# Patient Record
Sex: Male | Born: 1985
Health system: Southern US, Community
[De-identification: ages and names within clinical notes are randomized; demographics above are authoritative.]

## PROBLEM LIST (undated history)

## (undated) DIAGNOSIS — E119 Type 2 diabetes mellitus without complications: Secondary | ICD-10-CM

## (undated) DIAGNOSIS — T7840XA Allergy, unspecified, initial encounter: Secondary | ICD-10-CM

## (undated) DIAGNOSIS — I10 Essential (primary) hypertension: Secondary | ICD-10-CM

## (undated) HISTORY — DX: Type 2 diabetes mellitus without complications: E11.9

## (undated) HISTORY — DX: Essential (primary) hypertension: I10

## (undated) HISTORY — DX: Allergy, unspecified, initial encounter: T78.40XA

---
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
## 2011-08-19 ENCOUNTER — Emergency Department: Payer: Self-pay | Admitting: Emergency Medicine

## 2018-02-18 DIAGNOSIS — R7989 Other specified abnormal findings of blood chemistry: Secondary | ICD-10-CM

## 2018-02-18 HISTORY — DX: Other specified abnormal findings of blood chemistry: R79.89

## 2020-03-28 NOTE — Progress Notes (Signed)
Scheduled to complete physical 04/17/20.  (Provider TBD)  AMD 

## 2020-03-29 ENCOUNTER — Other Ambulatory Visit: Payer: Self-pay

## 2020-03-29 ENCOUNTER — Ambulatory Visit: Payer: Self-pay

## 2020-03-29 DIAGNOSIS — Z0000 Encounter for general adult medical examination without abnormal findings: Secondary | ICD-10-CM

## 2020-03-30 LAB — CMP12+LP+TP+TSH+6AC+CBC/D/PLT
ALT: 37 IU/L (ref 0–44)
AST: 32 IU/L (ref 0–40)
Albumin/Globulin Ratio: 1.7 (ref 1.2–2.2)
Albumin: 4.6 g/dL (ref 4.0–5.0)
Alkaline Phosphatase: 78 IU/L (ref 39–117)
BUN/Creatinine Ratio: 10 (ref 9–20)
BUN: 12 mg/dL (ref 6–20)
Basophils Absolute: 0 10*3/uL (ref 0.0–0.2)
Basos: 1 %
Bilirubin Total: 0.3 mg/dL (ref 0.0–1.2)
Calcium: 9.8 mg/dL (ref 8.7–10.2)
Chloride: 100 mmol/L (ref 96–106)
Chol/HDL Ratio: 4.2 ratio (ref 0.0–5.0)
Cholesterol, Total: 186 mg/dL (ref 100–199)
Creatinine, Ser: 1.19 mg/dL (ref 0.76–1.27)
EOS (ABSOLUTE): 0.1 10*3/uL (ref 0.0–0.4)
Eos: 1 %
Estimated CHD Risk: 0.8 times avg. (ref 0.0–1.0)
Free Thyroxine Index: 2.1 (ref 1.2–4.9)
GFR calc Af Amer: 92 mL/min/{1.73_m2} (ref >59)
GFR calc non Af Amer: 80 mL/min/{1.73_m2} (ref >59)
GGT: 56 IU/L (ref 0–65)
Globulin, Total: 2.7 g/dL (ref 1.5–4.5)
Glucose: 87 mg/dL (ref 65–99)
HDL: 44 mg/dL (ref >39)
Hematocrit: 44.2 % (ref 37.5–51.0)
Hemoglobin: 14.1 g/dL (ref 13.0–17.7)
Immature Grans (Abs): 0 10*3/uL (ref 0.0–0.1)
Immature Granulocytes: 0 %
Iron: 99 ug/dL (ref 38–169)
LDH: 222 IU/L (ref 121–224)
LDL Chol Calc (NIH): 103 mg/dL — ABNORMAL HIGH (ref 0–99)
Lymphocytes Absolute: 2.4 10*3/uL (ref 0.7–3.1)
Lymphs: 40 %
MCH: 27 pg (ref 26.6–33.0)
MCHC: 31.9 g/dL (ref 31.5–35.7)
MCV: 85 fL (ref 79–97)
Monocytes Absolute: 0.7 10*3/uL (ref 0.1–0.9)
Monocytes: 11 %
Neutrophils Absolute: 2.8 10*3/uL (ref 1.4–7.0)
Neutrophils: 47 %
Phosphorus: 1.9 mg/dL — ABNORMAL LOW (ref 2.8–4.1)
Platelets: 245 10*3/uL (ref 150–450)
Potassium: 4.1 mmol/L (ref 3.5–5.2)
RBC: 5.22 x10E6/uL (ref 4.14–5.80)
RDW: 12.3 % (ref 11.6–15.4)
Sodium: 137 mmol/L (ref 134–144)
T3 Uptake Ratio: 28 % (ref 24–39)
T4, Total: 7.6 ug/dL (ref 4.5–12.0)
TSH: 3.38 u[IU]/mL (ref 0.450–4.500)
Total Protein: 7.3 g/dL (ref 6.0–8.5)
Triglycerides: 226 mg/dL — ABNORMAL HIGH (ref 0–149)
Uric Acid: 6.9 mg/dL (ref 3.8–8.4)
VLDL Cholesterol Cal: 39 mg/dL (ref 5–40)
WBC: 6 10*3/uL (ref 3.4–10.8)

## 2020-04-17 ENCOUNTER — Encounter: Payer: Self-pay | Admitting: Emergency Medicine

## 2020-04-23 ENCOUNTER — Encounter: Payer: Self-pay | Admitting: Physician Assistant

## 2020-05-07 ENCOUNTER — Encounter: Payer: Self-pay | Admitting: Emergency Medicine

## 2020-05-07 ENCOUNTER — Ambulatory Visit: Payer: Self-pay | Admitting: Emergency Medicine

## 2020-05-07 ENCOUNTER — Other Ambulatory Visit: Payer: Self-pay

## 2020-05-07 VITALS — BP 191/92 | HR 129 | Temp 98.1°F | Resp 16 | Ht 70.0 in | Wt 339.0 lb

## 2020-05-07 DIAGNOSIS — Z0000 Encounter for general adult medical examination without abnormal findings: Secondary | ICD-10-CM

## 2020-05-07 DIAGNOSIS — I10 Essential (primary) hypertension: Secondary | ICD-10-CM

## 2020-05-07 DIAGNOSIS — Z91199 Patient's noncompliance with other medical treatment and regimen due to unspecified reason: Secondary | ICD-10-CM

## 2020-05-07 DIAGNOSIS — I4892 Unspecified atrial flutter: Secondary | ICD-10-CM

## 2020-05-07 LAB — POCT URINALYSIS DIPSTICK
Bilirubin, UA: NEGATIVE
Blood, UA: NEGATIVE
Glucose, UA: NEGATIVE
Ketones, UA: NEGATIVE
Leukocytes, UA: NEGATIVE
Nitrite, UA: NEGATIVE
Protein, UA: NEGATIVE
Spec Grav, UA: 1.02 (ref 1.010–1.025)
Urobilinogen, UA: 0.2 E.U./dL
pH, UA: 6 (ref 5.0–8.0)

## 2020-05-07 MED ORDER — AMLODIPINE BESYLATE 5 MG PO TABS: 5.0000 mg | ORAL_TABLET | Freq: Every day | ORAL | 3 refills | Status: DC

## 2020-05-07 NOTE — Progress Notes (Signed)
__________________________________________  I have reviewed the triage vital signs and the nursing notes.   HISTORY  Chief Complaint Employment Physical   HPI YSMAEL Morrison is a 34 y.o. male   Is here for his yearly physical.  He denies any medical complaints.  He states he always gets nervous when he comes to get his physical.  He reports that he ran out of blood pressure medication 2019 and because of Covid never called to see if he could get it refilled.  He denies any dizziness, shortness of breath, chest pain with resting or exertion.  Patient does have a family history of hypertension.   No past medical history on file.  There are no problems to display for this patient.    Prior to Admission medications   Medication Sig Start Date End Date Taking? Authorizing Provider  amLODipine (NORVASC) 5 MG tablet Take 1 tablet (5 mg total) by mouth daily. 05/07/20   [name], PA-C    Allergies Patient has no known allergies.  Family History  Problem Relation Age of Onset  . Hypertension Mother   . Heart disease Maternal Grandmother     Social History Social History   Tobacco Use  . Smoking status: Never Smoker  . Smokeless tobacco: Never Used  Substance Use Topics  . Alcohol use: Never  . Drug use: Never    Review of Systems Constitutional: No fever/chills Eyes: No visual changes. ENT: No sore throat. Cardiovascular: Denies chest pain. Respiratory: Denies shortness of breath.  Negative for cough. Gastrointestinal: No abdominal pain.  No nausea, no vomiting.   Musculoskeletal: Negative for muscle skeletal pain. Skin: Negative for rash. Neurological: Negative for headaches, focal weakness or numbness. ____________________________________________   PHYSICAL EXAM: Constitutional: Alert and oriented. Well appearing and in no acute distress. Eyes: Conjunctivae are normal. PERRL. EOMI. Head: Atraumatic. Nose: No congestion/rhinnorhea. Neck: No  stridor.   Cardiovascular: Rapid rate, regular rhythm. Grossly normal heart sounds.  Good peripheral circulation. Respiratory: Normal respiratory effort.  No retractions. Lungs CTAB. Gastrointestinal: Soft and nontender. No distention.  Bowel sounds are normoactive x4 quadrants.  BMI 48.6 Musculoskeletal: No tenderness is noted on palpation of the thoracic or lumbar spine.  Patient is able move upper and lower extremities without any difficulty.  No edema is noted to the lower extremities.  Normal gait was noted. Neurologic:  Normal speech and language. No gross focal neurologic deficits are appreciated. No gait instability. Skin:  Skin is warm, dry and intact. No rash noted. Psychiatric: Mood and affect are normal. Speech and behavior are normal.  ____________________________________________   LABS (all labs ordered are listed, but only abnormal results are displayed)  Discussed with patient. ____________________________________________  EKG No previous EKGs for comparison. Atrial flutter with a rate of 117.   FINAL CLINICAL IMPRESSION(S) / ED DIAGNOSES Annual physical exam Hypertension uncontrolled and medically noncompliant Atrial flutter  34 year old male is here for an annual physical exam with the [name].  He has not been on his blood pressure medication in more than a year and records indicate that he was taking Norvasc 5 mg daily prior to discontinuing it.  Patient states that he always gets extremely nervous when he is in the doctor's office.  Patient has a family history of hypertension.  Previous visits in his paper chart show elevation of his blood pressure.  Patient denies any chest pain, shortness of breath, dizziness, headaches or fatigue.  There is no way of knowing how long his  blood pressure has been elevated or atrial flutter.  Patient was told to begin with a baby aspirin per day.  He is to start back on his Norvasc immediately and a prescription was sent to  his pharmacy.  He is also agreeable to be evaluated by a cardiologist as he is just 34 years old.  He is aware that he is stable but should he began having any of the above symptoms he is to go immediately to the emergency department.  Referral was made for patient to be seen by cardiology.    ED Discharge Orders         Ordered    EKG 12-Lead        05/07/20 1556    amLODipine (NORVASC) 5 MG tablet  Daily     Discontinue  Reprint     05/07/20 1635    Ambulatory referral to Cardiology     Discontinue    Comments: Newly discovered A-Flutter.  Hx of HTN not controlled.  Pt. Not taking medication for 1 year   05/07/20 1635    POCT urinalysis dipstick        05/07/20 1658           Note:  This document was prepared using Dragon voice recognition software and may include unintentional dictation errors.

## 2020-05-14 ENCOUNTER — Other Ambulatory Visit: Payer: Self-pay

## 2020-05-14 ENCOUNTER — Encounter: Payer: Self-pay | Admitting: Cardiology

## 2020-05-14 ENCOUNTER — Ambulatory Visit (INDEPENDENT_AMBULATORY_CARE_PROVIDER_SITE_OTHER): Payer: 59 | Admitting: Cardiology

## 2020-05-14 VITALS — BP 180/100 | HR 108 | Ht 70.0 in | Wt 336.5 lb

## 2020-05-14 DIAGNOSIS — I10 Essential (primary) hypertension: Secondary | ICD-10-CM | POA: Diagnosis not present

## 2020-05-14 DIAGNOSIS — R9431 Abnormal electrocardiogram [ECG] [EKG]: Secondary | ICD-10-CM | POA: Diagnosis not present

## 2020-05-14 MED ORDER — HYDROCHLOROTHIAZIDE 25 MG PO TABS: 25.0000 mg | ORAL_TABLET | Freq: Every day | ORAL | 6 refills | Status: DC

## 2020-05-14 NOTE — Progress Notes (Signed)
Cardiology Office Note:    Date:  05/14/2020   ID:  Randall Morrison, DOB 08/04/1986, MRN [id number]  PCP:  No primary care provider on file.  [facility] Cardiologist:  Debbe Odea, MD  [facility] Electrophysiologist:  None   Referring MD: Tommi Rumps, PA-C   Chief Complaint  Patient presents with  . OTHER    Atrial flutter/Elevated BP. Meds reviewed verbally with pt.   Randall Morrison is a 34 y.o. male who is being seen today for the evaluation of atrial flutter at the request of Tommi Rumps, PA-C.   History of Present Illness:    Randall Morrison is a 34 y.o. male with a hx of hypertension who presents due to newly diagnosed atrial flutter.  Patient presented to the city [facility] for regular physical exam.  EKG was read as atrial flutter.  Patient denies any history of heart disease.  Denies palpitations, chest pain or shortness of breath.  Was started on aspirin and advised to follow-up with cardiology.   Patient states that with history of hypertension.  Did not take any blood pressure medications for a while due to the appointment.  Recently restarted on Norvasc 5 mg daily.  Checks his blood pressure frequently at home and systolics are usually in the 140s to 150s.    Past Medical History:  Diagnosis Date  . Hypertension     Past Surgical History:  Procedure Laterality Date  . TONSILLECTOMY Bilateral 1995    Current Medications: Current Meds  Medication Sig  . amLODipine (NORVASC) 5 MG tablet Take 1 tablet (5 mg total) by mouth daily.     Allergies:   Patient has no known allergies.   Social History   Socioeconomic History  . Marital status: Single    Spouse name: Not on file  . Number of children: Not on file  . Years of education: Not on file  . Highest education level: Not on file  Occupational History  . Not on file  Tobacco Use  . Smoking status: Never Smoker  . Smokeless tobacco: Never Used  Substance and  Sexual Activity  . Alcohol use: Yes    Comment: Social  . Drug use: Never  . Sexual activity: Not on file  Other Topics Concern  . Not on file  Social History Narrative  . Not on file   Social Determinants of Health   Financial Resource Strain:   . Difficulty of Paying Living Expenses:   Food Insecurity:   . Worried About Programme researcher, broadcasting/film/video in the Last Year:   . Barista in the Last Year:   Transportation Needs:   . Freight forwarder (Medical):   Marland Kitchen Lack of Transportation (Non-Medical):   Physical Activity:   . Days of Exercise per Week:   . Minutes of Exercise per Session:   Stress:   . Feeling of Stress :   Social Connections:   . Frequency of Communication with Friends and Family:   . Frequency of Social Gatherings with Friends and Family:   . Attends Religious Services:   . Active Member of Clubs or Organizations:   . Attends Banker Meetings:   Marland Kitchen Marital Status:      Family History: The patient's family history includes Heart disease in his maternal grandmother; Hypertension in his mother.  ROS:   Please see the history of present illness.     All other systems reviewed and are negative.  EKGs/Labs/Other Studies Reviewed:    The following studies were reviewed today:   EKG:  EKG is  ordered today.  The ekg ordered today demonstrates sinus tachycardia otherwise normal no significant  Recent Labs: 03/29/2020: ALT 37; BUN 12; Creatinine, Ser 1.19; Hemoglobin 14.1; Platelets 245; Potassium 4.1; Sodium 137; TSH 3.380  Recent Lipid Panel    Component Value Date/Time   CHOL 186 03/29/2020 0911   TRIG 226 (H) 03/29/2020 0911   HDL 44 03/29/2020 0911   CHOLHDL 4.2 03/29/2020 0911   LDLCALC 103 (H) 03/29/2020 0911    Physical Exam:    VS:  BP (!) 180/100 (BP Location: Right Arm, Patient Position: Sitting, Cuff Size: Large)   Pulse (!) 108   Ht 5\' 10"  (1.778 m)   Wt (!) 336 lb 8 oz (152.6 kg)   SpO2 98%   BMI 48.28 kg/m     Wt  Readings from Last 3 Encounters:  05/14/20 (!) 336 lb 8 oz (152.6 kg)  05/07/20 (!) 339 lb (153.8 kg)     GEN:  Well nourished, well developed in no acute distress HEENT: Normal NECK: No JVD; No carotid bruits LYMPHATICS: No lymphadenopathy CARDIAC: RRR, no murmurs, rubs, gallops RESPIRATORY:  Clear to auscultation without rales, wheezing or rhonchi  ABDOMEN: Soft, non-tender, non-distended MUSCULOSKELETAL:  No edema; No deformity  SKIN: Warm and dry NEUROLOGIC:  Alert and oriented x 3  PSYCHIATRIC:  Normal affect   ASSESSMENT:    1. Nonspecific abnormal electrocardiogram (ECG) (EKG)   2. Essential hypertension    PLAN:    In order of problems listed above:  1. Outpatient EKG read as atrial flutter.  EKG reviewed by myself and rhythm was sinus tachycardia and not atrial flutter.  EKG in the office today is sinus.  Patient reassured.  No indication for aspirin at this time. 2. Patient with history of hypertension, blood pressure not well controlled.  Start HCTZ 25 mg daily.  Continue amlodipine 5 mg daily.  Low-salt diet advised, frequent blood pressure checks at home recommended.  If BP still elevated at follow-up visit, plan to increase Norvasc.  Follow-up in 4 weeks for BP management   Medication Adjustments/Labs and Tests Ordered: Current medicines are reviewed at length with the patient today.  Concerns regarding medicines are outlined above.  Orders Placed This Encounter  Procedures  . EKG 12-Lead   Meds ordered this encounter  Medications  . hydrochlorothiazide (HYDRODIURIL) 25 MG tablet    Sig: Take 1 tablet (25 mg total) by mouth daily.    Dispense:  30 tablet    Refill:  6    Patient Instructions  Medication Instructions:   Your physician has recommended you make the following change in your medication:   START Hydrochlorothiazide: Take 1 tablet (25 mg total) by mouth daily.  *If you need a refill on your cardiac medications before your next appointment,  please call your pharmacy*   Lab Work: None Ordered. If you have labs (blood work) drawn today and your tests are completely normal, you will receive your results only by: Marland Kitchen MyChart Message (if you have MyChart) OR . A paper copy in the mail If you have any lab test that is abnormal or we need to change your treatment, we will call you to review the results.   Testing/Procedures: None Ordered.   Follow-Up: At [facility], you and your health needs are our priority.  As part of our continuing mission to provide you with exceptional heart care,  we have created designated Provider Care Teams.  These Care Teams include your primary Cardiologist (physician) and Advanced Practice Providers (APPs -  Physician Assistants and Nurse Practitioners) who all work together to provide you with the care you need, when you need it.  We recommend signing up for the patient portal called "MyChart".  Sign up information is provided on this After Visit Summary.  MyChart is used to connect with patients for Virtual Visits (Telemedicine).  Patients are able to view lab/test results, encounter notes, upcoming appointments, etc.  Non-urgent messages can be sent to your provider as well.   To learn more about what you can do with MyChart, go to [email or web address].    Your next appointment:   4 week(s)  The format for your next appointment:   In Person  Provider:   Debbe Odea, MD   Other Instructions  N/A     Signed, Debbe Odea, MD  05/14/2020 1:11 PM    [facility]

## 2020-05-14 NOTE — Patient Instructions (Addendum)
Medication Instructions:   Your physician has recommended you make the following change in your medication:   START Hydrochlorothiazide: Take 1 tablet (25 mg total) by mouth daily.  *If you need a refill on your cardiac medications before your next appointment, please call your pharmacy*   Lab Work: None Ordered. If you have labs (blood work) drawn today and your tests are completely normal, you will receive your results only by: Marland Kitchen MyChart Message (if you have MyChart) OR . A paper copy in the mail If you have any lab test that is abnormal or we need to change your treatment, we will call you to review the results.   Testing/Procedures: None Ordered.   Follow-Up: At [facility], you and your health needs are our priority.  As part of our continuing mission to provide you with exceptional heart care, we have created designated Provider Care Teams.  These Care Teams include your primary Cardiologist (physician) and Advanced Practice Providers (APPs -  Physician Assistants and Nurse Practitioners) who all work together to provide you with the care you need, when you need it.  We recommend signing up for the patient portal called "MyChart".  Sign up information is provided on this After Visit Summary.  MyChart is used to connect with patients for Virtual Visits (Telemedicine).  Patients are able to view lab/test results, encounter notes, upcoming appointments, etc.  Non-urgent messages can be sent to your provider as well.   To learn more about what you can do with MyChart, go to [email or web address].    Your next appointment:   4 week(s)  The format for your next appointment:   In Person  Provider:   Debbe Odea, MD   Other Instructions  N/A

## 2020-05-29 ENCOUNTER — Emergency Department
Admission: EM | Admit: 2020-05-29 | Discharge: 2020-05-29 | Disposition: A | Discharge status: Home / Self Care | Payer: 59 | Attending: Emergency Medicine | Admitting: Emergency Medicine

## 2020-05-29 ENCOUNTER — Other Ambulatory Visit: Payer: Self-pay

## 2020-05-29 ENCOUNTER — Emergency Department: Payer: 59

## 2020-05-29 DIAGNOSIS — R29810 Facial weakness: Secondary | ICD-10-CM | POA: Diagnosis present

## 2020-05-29 DIAGNOSIS — G510 Bell's palsy: Secondary | ICD-10-CM | POA: Insufficient documentation

## 2020-05-29 DIAGNOSIS — Z79899 Other long term (current) drug therapy: Secondary | ICD-10-CM | POA: Diagnosis not present

## 2020-05-29 DIAGNOSIS — I10 Essential (primary) hypertension: Secondary | ICD-10-CM | POA: Diagnosis not present

## 2020-05-29 LAB — COMPREHENSIVE METABOLIC PANEL
ALT: 38 U/L (ref 0–44)
AST: 29 U/L (ref 15–41)
Albumin: 4.6 g/dL (ref 3.5–5.0)
Alkaline Phosphatase: 58 U/L (ref 38–126)
Anion gap: 5 (ref 5–15)
BUN: 15 mg/dL (ref 6–20)
CO2: 30 mmol/L (ref 22–32)
Calcium: 9.6 mg/dL (ref 8.9–10.3)
Chloride: 102 mmol/L (ref 98–111)
Creatinine, Ser: 1.27 mg/dL — ABNORMAL HIGH (ref 0.61–1.24)
GFR calc Af Amer: >60 mL/min (ref >60)
GFR calc non Af Amer: >60 mL/min (ref >60)
Glucose, Bld: 144 mg/dL — ABNORMAL HIGH (ref 70–99)
Potassium: 4 mmol/L (ref 3.5–5.1)
Sodium: 137 mmol/L (ref 135–145)
Total Bilirubin: 0.6 mg/dL (ref 0.3–1.2)
Total Protein: 7.9 g/dL (ref 6.5–8.1)

## 2020-05-29 LAB — CBC
HCT: 45.6 % (ref 39.0–52.0)
Hemoglobin: 14.4 g/dL (ref 13.0–17.0)
MCH: 26.8 pg (ref 26.0–34.0)
MCHC: 31.6 g/dL (ref 30.0–36.0)
MCV: 84.9 fL (ref 80.0–100.0)
Platelets: 251 10*3/uL (ref 150–400)
RBC: 5.37 MIL/uL (ref 4.22–5.81)
RDW: 12.5 % (ref 11.5–15.5)
WBC: 6.2 10*3/uL (ref 4.0–10.5)
nRBC: 0 % (ref 0.0–0.2)

## 2020-05-29 LAB — DIFFERENTIAL
Abs Immature Granulocytes: 0.02 10*3/uL (ref 0.00–0.07)
Basophils Absolute: 0 10*3/uL (ref 0.0–0.1)
Basophils Relative: 1 %
Eosinophils Absolute: 0.1 10*3/uL (ref 0.0–0.5)
Eosinophils Relative: 2 %
Immature Granulocytes: 0 %
Lymphocytes Relative: 38 %
Lymphs Abs: 2.4 10*3/uL (ref 0.7–4.0)
Monocytes Absolute: 0.5 10*3/uL (ref 0.1–1.0)
Monocytes Relative: 8 %
Neutro Abs: 3.1 10*3/uL (ref 1.7–7.7)
Neutrophils Relative %: 51 %

## 2020-05-29 LAB — PROTIME-INR
INR: 0.9 (ref 0.8–1.2)
Prothrombin Time: 12 seconds (ref 11.4–15.2)

## 2020-05-29 LAB — APTT: aPTT: 28 seconds (ref 24–36)

## 2020-05-29 MED ORDER — PREDNISONE 20 MG PO TABS: 60.0000 mg | ORAL_TABLET | Freq: Every day | ORAL | 0 refills | Status: AC

## 2020-05-29 MED ORDER — VALACYCLOVIR HCL 1 G PO TABS
1000.0000 mg | ORAL_TABLET | Freq: Three times a day (TID) | ORAL | 0 refills | Status: AC
Start: 2020-05-29 — End: 2020-06-05

## 2020-05-29 MED ORDER — SODIUM CHLORIDE 0.9% FLUSH
3.0000 mL | Freq: Once | INTRAVENOUS | Status: DC
Start: 2020-05-29 — End: 2020-05-30

## 2020-05-29 MED ORDER — PREDNISONE 20 MG PO TABS: 60.0000 mg | ORAL_TABLET | Freq: Every day | ORAL | 0 refills | Status: DC

## 2020-05-29 NOTE — ED Triage Notes (Addendum)
Pt comes via POV from home with c/o numbness to mouth. Pt states yesterday morning he was mouthwashing and was not able to fully swish.  Pt states he hit his head hard on a forklift.  Pt also states his left eye he is not able to fully close it. Pt states numbness to left side of mouth.  Pt denies any blurred vision. Pt denies any weakness. VAN negative.

## 2020-05-29 NOTE — ED Provider Notes (Signed)
[facility] Emergency Department Provider Note  ____________________________________________   I have reviewed the triage vital signs and the nursing notes.   HISTORY  Chief Complaint Left facial weakness  History limited by: Not Limited   HPI Randall Morrison is a 34 y.o. male who presents to the emergency department today with primary complaint of left facial weakness.  The patient states that he did hit his head on a forklift couple of days before he noticed some left sided weakness.  He noticed that he had a change in the taste in his mouth.  He also noticed issues with fully closing his left eye.  The patient denies any significant headache states he had a little bit of pain right after traumatic injury although none since then.  Patient denies any weakness or numbness in his upper or lower extremities.    Records reviewed. Per medical record review patient has a history of hypertension  Past Medical History:  Diagnosis Date  . Hypertension     There are no problems to display for this patient.   Past Surgical History:  Procedure Laterality Date  . TONSILLECTOMY Bilateral 1995    Prior to Admission medications   Medication Sig Start Date End Date Taking? Authorizing Provider  amLODipine (NORVASC) 5 MG tablet Take 1 tablet (5 mg total) by mouth daily. 05/07/20   Tommi Rumps, PA-C  hydrochlorothiazide (HYDRODIURIL) 25 MG tablet Take 1 tablet (25 mg total) by mouth daily. 05/14/20 08/12/20  Debbe Odea, MD    Allergies Patient has no known allergies.  Family History  Problem Relation Age of Onset  . Hypertension Mother   . Heart disease Maternal Grandmother     Social History Social History   Tobacco Use  . Smoking status: Never Smoker  . Smokeless tobacco: Never Used  Substance Use Topics  . Alcohol use: Yes    Comment: Social  . Drug use: Never    Review of Systems Constitutional: No fever/chills Eyes: No vision  change. Left eye watering. ENT: No sore throat. Cardiovascular: Denies chest pain. Respiratory: Denies shortness of breath. Gastrointestinal: No abdominal pain.  No nausea, no vomiting.  No diarrhea.   Genitourinary: Negative for dysuria. Musculoskeletal: Negative for back pain. Skin: Negative for rash. Neurological: Left facial weakness. ____________________________________________   PHYSICAL EXAM:  VITAL SIGNS: ED Triage Vitals [05/29/20 1443]  Enc Vitals Group     BP (!) 156/99     Pulse Rate (!) 111     Resp 18     Temp 98 F (36.7 C)     Temp src      SpO2 98 %     Weight (!) 336 lb 8 oz (152.6 kg)     Height 5\' 10"  (1.778 m)     Head Circumference      Peak Flow      Pain Score 3   Constitutional: Alert and oriented.  Eyes: Slight injection left eye. ENT      Head: Normocephalic and atraumatic.      Nose: No congestion/rhinnorhea.      Mouth/Throat: Mucous membranes are moist.      Neck: No stridor. Hematological/Lymphatic/Immunilogical: No cervical lymphadenopathy. Cardiovascular: Normal rate, regular rhythm.  No murmurs, rubs, or gallops.  Respiratory: Normal respiratory effort without tachypnea nor retractions. Breath sounds are clear and equal bilaterally. No wheezes/rales/rhonchi. Gastrointestinal: Soft and non tender. No rebound. No guarding.  Genitourinary: Deferred Musculoskeletal: Normal range of motion in all extremities. No lower extremity  edema. Neurologic:  Left facial weakness with decreased left eyebrow elevation, strength. Strength 5/5 in all extremities. Sensation grossly intact.  Skin:  Skin is warm, dry and intact. No rash noted. Psychiatric: Mood and affect are normal. Speech and behavior are normal. Patient exhibits appropriate insight and judgment.  ____________________________________________    LABS (pertinent positives/negatives)  CMP wnl except glu 144, cr 1.27 CBC wbc 6.2, hgb 14.4, plt  251  ____________________________________________   EKG  I, Phineas Semen, attending physician, personally viewed and interpreted this EKG  EKG Time: 1449 Rate: 103 Rhythm: sinus tachycardia Axis: normal Intervals: qtc 413 QRS: narrow ST changes: no st elevation Impression: abnormal ekg  ____________________________________________    RADIOLOGY  CT head No acute abnormality  ____________________________________________   PROCEDURES  Procedures  ____________________________________________   INITIAL IMPRESSION / ASSESSMENT AND PLAN / ED COURSE  Pertinent labs & imaging results that were available during my care of the patient were reviewed by me and considered in my medical decision making (see chart for details).   Patient presents to the emergency department today with left facial weakness.  Physical exam is consistent with Bell's palsy given weakness of the left eyebrow.  Patient did say he hit his head before the symptoms started, head CT was negative.  This point I have very low suspicion for stroke.  I did discuss that is a possibility with the patient however at this time given exam consistent with Bell's palsy do not think MRI is necessary.  Discussed with patient treatment of Bell's palsy and concern for eye care.  Did discuss importance of primary care follow-up. Will also give neurology follow up information.  ____________________________________________   FINAL CLINICAL IMPRESSION(S) / ED DIAGNOSES  Final diagnoses:  Bell's palsy     Note: This dictation was prepared with Dragon dictation. Any transcriptional errors that result from this process are unintentional     Phineas Semen, MD 05/29/20 1901

## 2020-05-29 NOTE — Discharge Instructions (Signed)
Please seek medical attention for any high fevers, chest pain, shortness of breath, change in behavior, persistent vomiting, bloody stool or any other new or concerning symptoms.  

## 2020-06-11 ENCOUNTER — Encounter: Payer: Self-pay | Admitting: Cardiology

## 2020-06-11 ENCOUNTER — Other Ambulatory Visit: Payer: Self-pay

## 2020-06-11 ENCOUNTER — Ambulatory Visit (INDEPENDENT_AMBULATORY_CARE_PROVIDER_SITE_OTHER): Payer: 59 | Admitting: Cardiology

## 2020-06-11 VITALS — BP 180/96 | HR 112 | Ht 70.0 in | Wt 333.2 lb

## 2020-06-11 DIAGNOSIS — I10 Essential (primary) hypertension: Secondary | ICD-10-CM

## 2020-06-11 MED ORDER — AMLODIPINE BESYLATE 10 MG PO TABS: 10.0000 mg | ORAL_TABLET | Freq: Every day | ORAL | 5 refills | Status: DC

## 2020-06-11 NOTE — Patient Instructions (Signed)
Medication Instructions:  Your physician has recommended you make the following change in your medication:   INCREASE Amlodipine to 10 mg daily. An Rx has been sent ton your pharmacy.  *If you need a refill on your cardiac medications before your next appointment, please call your pharmacy*   Lab Work: None ordered If you have labs (blood work) drawn today and your tests are completely normal, you will receive your results only by: Marland Kitchen MyChart Message (if you have MyChart) OR . A paper copy in the mail If you have any lab test that is abnormal or we need to change your treatment, we will call you to review the results.   Testing/Procedures: None ordered   Follow-Up: At [facility], you and your health needs are our priority.  As part of our continuing mission to provide you with exceptional heart care, we have created designated Provider Care Teams.  These Care Teams include your primary Cardiologist (physician) and Advanced Practice Providers (APPs -  Physician Assistants and Nurse Practitioners) who all work together to provide you with the care you need, when you need it.  We recommend signing up for the patient portal called "MyChart".  Sign up information is provided on this After Visit Summary.  MyChart is used to connect with patients for Virtual Visits (Telemedicine).  Patients are able to view lab/test results, encounter notes, upcoming appointments, etc.  Non-urgent messages can be sent to your provider as well.   To learn more about what you can do with MyChart, go to [email or web address].    Your next appointment:   2 week(s)  The format for your next appointment:   In Person  Provider:    You may see Debbe Odea, MD or one of the following Advanced Practice Providers on your designated Care Team:    Nicolasa Ducking, NP  Eula Listen, PA-C  Marisue Ivan, PA-C    Other Instructions N/A

## 2020-06-11 NOTE — Progress Notes (Signed)
Cardiology Office Note:    Date:  06/11/2020   ID:  [name], DOB May 20, 1986, MRN [id number]  PCP:  Patient, No Pcp Per  [facility] Cardiologist:  Debbe Odea, MD  [facility] Electrophysiologist:  None   Referring MD: No ref. provider found   Chief Complaint  Patient presents with  . OTHER    4 wk f/u no complaints today. Meds reviewed verbally with pt.     History of Present Illness:    Randall Morrison is a 34 y.o. male with a hx of hypertension who presents for follow-up.  Patient last seen due to abnormal ECG and hypertension.  EKG upon further review was sinus rhythm.  Blood pressure was elevated after last visit, HCTZ was started, amlodipine was continued as prescribed.  He states his blood pressure has improved at home, he checks his blood pressure frequently with systolics in the 120s to 160s.  He is tolerating medications okay.   Past Medical History:  Diagnosis Date  . Hypertension     Past Surgical History:  Procedure Laterality Date  . TONSILLECTOMY Bilateral 1995    Current Medications: Current Meds  Medication Sig  . amLODipine (NORVASC) 10 MG tablet Take 1 tablet (10 mg total) by mouth daily.  . hydrochlorothiazide (HYDRODIURIL) 25 MG tablet Take 1 tablet (25 mg total) by mouth daily.  . [DISCONTINUED] amLODipine (NORVASC) 5 MG tablet Take 1 tablet (5 mg total) by mouth daily.     Allergies:   Patient has no known allergies.   Social History   Socioeconomic History  . Marital status: Single    Spouse name: Not on file  . Number of children: Not on file  . Years of education: Not on file  . Highest education level: Not on file  Occupational History  . Not on file  Tobacco Use  . Smoking status: Never Smoker  . Smokeless tobacco: Never Used  Substance and Sexual Activity  . Alcohol use: Yes    Comment: Social  . Drug use: Never  . Sexual activity: Not on file  Other Topics Concern  . Not on file  Social History  Narrative  . Not on file   Social Determinants of Health   Financial Resource Strain:   . Difficulty of Paying Living Expenses:   Food Insecurity:   . Worried About Programme researcher, broadcasting/film/video in the Last Year:   . Barista in the Last Year:   Transportation Needs:   . Freight forwarder (Medical):   Marland Kitchen Lack of Transportation (Non-Medical):   Physical Activity:   . Days of Exercise per Week:   . Minutes of Exercise per Session:   Stress:   . Feeling of Stress :   Social Connections:   . Frequency of Communication with Friends and Family:   . Frequency of Social Gatherings with Friends and Family:   . Attends Religious Services:   . Active Member of Clubs or Organizations:   . Attends Banker Meetings:   Marland Kitchen Marital Status:      Family History: The patient's family history includes Heart disease in his maternal grandmother; Hypertension in his mother.  ROS:   Please see the history of present illness.     All other systems reviewed and are negative.  EKGs/Labs/Other Studies Reviewed:    The following studies were reviewed today:   EKG:  EKG is  ordered today.  The ekg ordered today demonstrates sinus tachycardia otherwise  normal   Recent Labs: 03/29/2020: TSH 3.380 05/29/2020: ALT 38; BUN 15; Creatinine, Ser 1.27; Hemoglobin 14.4; Platelets 251; Potassium 4.0; Sodium 137  Recent Lipid Panel    Component Value Date/Time   CHOL 186 03/29/2020 0911   TRIG 226 (H) 03/29/2020 0911   HDL 44 03/29/2020 0911   CHOLHDL 4.2 03/29/2020 0911   LDLCALC 103 (H) 03/29/2020 0911    Physical Exam:    VS:  BP (!) 180/96 (BP Location: Left Arm, Patient Position: Sitting, Cuff Size: Large)   Pulse (!) 112   Ht 5\' 10"  (1.778 m)   Wt (!) 333 lb 4 oz (151.2 kg)   SpO2 98%   BMI 47.82 kg/m     Wt Readings from Last 3 Encounters:  06/11/20 (!) 333 lb 4 oz (151.2 kg)  05/29/20 (!) 336 lb 8 oz (152.6 kg)  05/14/20 (!) 336 lb 8 oz (152.6 kg)     GEN:  Well  nourished, well developed in no acute distress HEENT: Normal NECK: No JVD; No carotid bruits LYMPHATICS: No lymphadenopathy CARDIAC: RRR, no murmurs, rubs, gallops RESPIRATORY:  Clear to auscultation without rales, wheezing or rhonchi  ABDOMEN: Soft, non-tender, non-distended MUSCULOSKELETAL:  No edema; No deformity  SKIN: Warm and dry NEUROLOGIC:  Alert and oriented x 3  PSYCHIATRIC:  Normal affect   ASSESSMENT:    1. Essential hypertension   2. Morbid obesity (HCC)    PLAN:    In order of problems listed above:  1. Patient with history of hypertension, blood pressure not well controlled.  Increase amlodipine to 10 mg daily, continue HCTZ 25 mg daily.  Low-salt diet advised, frequent blood pressure checks at home recommended.  If BP elevated at follow-up visit, plan to add ACE/ARB. 2. Patient is morbidly obese, low-calorie diet, exercise, weight loss advised.  Follow-up in 2 weeks for BP management  Total encounter time more than 40 minutes  Greater than 50% was spent in counseling and coordination of care with the patient, including medication management.    Medication Adjustments/Labs and Tests Ordered: Current medicines are reviewed at length with the patient today.  Concerns regarding medicines are outlined above.  Orders Placed This Encounter  Procedures  . EKG 12-Lead   Meds ordered this encounter  Medications  . amLODipine (NORVASC) 10 MG tablet    Sig: Take 1 tablet (10 mg total) by mouth daily.    Dispense:  30 tablet    Refill:  5    Dosage increase    Patient Instructions  Medication Instructions:  Your physician has recommended you make the following change in your medication:   INCREASE Amlodipine to 10 mg daily. An Rx has been sent ton your pharmacy.  *If you need a refill on your cardiac medications before your next appointment, please call your pharmacy*   Lab Work: None ordered If you have labs (blood work) drawn today and your tests are  completely normal, you will receive your results only by: 05/16/20 MyChart Message (if you have MyChart) OR . A paper copy in the mail If you have any lab test that is abnormal or we need to change your treatment, we will call you to review the results.   Testing/Procedures: None ordered   Follow-Up: At [facility], you and your health needs are our priority.  As part of our continuing mission to provide you with exceptional heart care, we have created designated Provider Care Teams.  These Care Teams include your primary Cardiologist (physician) and  Advanced Practice Providers (APPs -  Physician Assistants and Nurse Practitioners) who all work together to provide you with the care you need, when you need it.  We recommend signing up for the patient portal called "MyChart".  Sign up information is provided on this After Visit Summary.  MyChart is used to connect with patients for Virtual Visits (Telemedicine).  Patients are able to view lab/test results, encounter notes, upcoming appointments, etc.  Non-urgent messages can be sent to your provider as well.   To learn more about what you can do with MyChart, go to [email or web address].    Your next appointment:   2 week(s)  The format for your next appointment:   In Person  Provider:    You may see Debbe Odea, MD or one of the following Advanced Practice Providers on your designated Care Team:    Nicolasa Ducking, NP  Eula Listen, PA-C  Marisue Ivan, PA-C    Other Instructions N/A     Signed, Debbe Odea, MD  06/11/2020 8:50 AM    [facility]

## 2020-06-24 NOTE — Progress Notes (Signed)
Office Visit    Patient Name: Randall Morrison Date of Encounter: 06/25/2020  Primary Care Provider:  Patient, No Pcp Per Primary Cardiologist:  Debbe Odea, MD Electrophysiologist:  None   Chief Complaint    Randall Morrison is a 34 y.o. male with a hx of HTN, obesity, Bell's Palsy 05/29/20 presents today for follow up of HTN.   Past Medical History    Past Medical History:  Diagnosis Date  . Hypertension    Past Surgical History:  Procedure Laterality Date  . TONSILLECTOMY Bilateral 1995    Allergies  No Known Allergies  History of Present Illness    Randall Morrison is a 34 y.o. male with a hx of HTN, obesity last seen 06/11/20 by Dr. Azucena Cecil.  He was initially referred for abnormal EKG (computer read as atrial flutter) which on review by Dr. Azucena Cecil was sinus tachycardia. At last clinic visit his Amlodipine was increased to 10mg  and his HCTZ continued.   Reports his blood pressure at home has been running routinely 130-140/80s.  Tolerating increased dose of amlodipine well.  Reports no shortness of breath nor dyspnea on exertion. Reports no chest pain, pressure, or tightness. No edema, orthopnea, PND. Reports no palpitations.   He has two children who we addressed many times with.  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   He does bring paperwork today for consent to perform employment test through [employer].  The cardiovascular fitness test includes 3-minute step test, flexibility assessment, dynamic lifting and for postures.  It is to stimulate shoveling, heavy lifting, etc.  As his blood pressure is now routinely less than 160/100 he may proceed with testing was provided with paperwork which additionally will be scanned into his chart.  EKGs/Labs/Other Studies Reviewed:   The following studies were reviewed today:  EKG:  EKG is ordered today.  The ekg ordered today demonstrates sinus tachycardia 115 bpm with no acute ST/T  wave changes.  Recent Labs: 03/29/2020: TSH 3.380 05/29/2020: ALT 38; BUN 15; Creatinine, Ser 1.27; Hemoglobin 14.4; Platelets 251; Potassium 4.0; Sodium 137  Recent Lipid Panel    Component Value Date/Time   CHOL 186 03/29/2020 0911   TRIG 226 (H) 03/29/2020 0911   HDL 44 03/29/2020 0911   CHOLHDL 4.2 03/29/2020 0911   LDLCALC 103 (H) 03/29/2020 0911    Home Medications   Current Meds  Medication Sig  . amLODipine (NORVASC) 10 MG tablet Take 1 tablet (10 mg total) by mouth daily.  . hydrochlorothiazide (HYDRODIURIL) 25 MG tablet Take 1 tablet (25 mg total) by mouth daily.    Review of Systems   Review of Systems  Constitutional: Negative for chills, fever and malaise/fatigue.  Cardiovascular: Negative for chest pain, dyspnea on exertion, leg swelling, near-syncope, orthopnea, palpitations and syncope.  Respiratory: Negative for cough, shortness of breath and wheezing.   Gastrointestinal: Negative for nausea and vomiting.  Neurological: Negative for dizziness, light-headedness and weakness.   All other systems reviewed and are otherwise negative except as noted above.  Physical Exam    VS:  BP (!) 168/80 (BP Location: Left Arm, Patient Position: Sitting, Cuff Size: Large)   Pulse (!) 115   Ht 5\' 10"  (1.778 m)   Wt (!) 330 lb (149.7 kg)   SpO2 99%   BMI 47.35 kg/m  , BMI Body mass index is 47.35 kg/m. GEN: Well nourished, overweight, well developed, in no acute distress. HEENT: normal. Neck: Supple, no  JVD, carotid bruits, or masses. Cardiac: RRR, tachycardic, no murmurs, rubs, or gallops. No clubbing, cyanosis, edema.  Radials/DP/PT 2+ and equal bilaterally.  Respiratory:  Respirations regular and unlabored, clear to auscultation bilaterally. GI: Soft, nontender, nondistended, BS + x 4. MS: No deformity or atrophy. Skin: Warm and dry, no rash. Neuro:  Strength and sensation are intact. Psych: Normal affect.  Assessment & Plan    1. HTN -initial BP today 168/80  with improvement to 156/80 at end of clinic visit without intervention.  Reports BPs at home when checked with arm cuff 130-148/80.  Continue HCTZ 25 mg daily.  Continue amlodipine 10 mg daily.  Start losartan 25 mg daily.  BMP in 2 weeks.  If BP remains elevated can further uptitrate losartan.    In setting of morbid obesity, HTN would likely benefit from sleep study and can discuss at future clinic visits.  He brought paperwork for consent to perform employment test including 3-minute step test, flexibility assessment, dynamic lifting (simulate pushing weighted Bram, shoveling, heavy lifting).  As his blood pressure control has improved, signed consent for him to complete testing.  2. Tachycardia - Asymptomatic with no palpitations.  Likely etiology deconditioning.  EKG today shows sinus tachycardia with no acute ST/T wave changes.  Future considerations will include addition of beta-blocker.  3. Morbid obesity - Weight loss with diet and exercise encouraged.  He has been working on being more physically active and watching what he eats.  Encouraged to continue.  Disposition: Follow up in 3 week(s) with Dr. Janann August or APP   Alver Sorrow, NP 06/25/2020, 8:13 AM

## 2020-06-25 ENCOUNTER — Ambulatory Visit (INDEPENDENT_AMBULATORY_CARE_PROVIDER_SITE_OTHER): Payer: 59 | Admitting: Family

## 2020-06-25 ENCOUNTER — Encounter: Payer: Self-pay | Admitting: Family

## 2020-06-25 ENCOUNTER — Other Ambulatory Visit: Payer: Self-pay

## 2020-06-25 VITALS — BP 156/80 | HR 115 | Ht 70.0 in | Wt 330.0 lb

## 2020-06-25 DIAGNOSIS — Z79899 Other long term (current) drug therapy: Secondary | ICD-10-CM | POA: Diagnosis not present

## 2020-06-25 DIAGNOSIS — I10 Essential (primary) hypertension: Secondary | ICD-10-CM | POA: Diagnosis not present

## 2020-06-25 MED ORDER — LOSARTAN POTASSIUM 25 MG PO TABS: 25.0000 mg | ORAL_TABLET | Freq: Every day | ORAL | 5 refills | Status: DC

## 2020-06-25 NOTE — Patient Instructions (Addendum)
Medication Instructions:  Your physician has recommended you make the following change in your medication:   START Losartan 25mg  daily  This is to help get your blood pressure to our goal of <130/80. It is a blood pressure medicine that also helps protect your kidneys.   *If you need a refill on your cardiac medications before your next appointment, please call your pharmacy*   Lab Work: Your physician recommends that you return for lab work in: 2 weeks for BMP (on or around 07/10/20). - Please go to the [facility]. You will check in at the front desk to the right as you walk into the atrium. Valet Parking is offered if needed. - No appointment needed. You may go any day between 7 am and 6 pm.   If you have labs (blood work) drawn today and your tests are completely normal, you will receive your results only by: [facility] MyChart Message (if you have MyChart) OR . A paper copy in the mail If you have any lab test that is abnormal or we need to change your treatment, we will call you to review the results.   Testing/Procedures: Your EKG today shows sinus tachycardia which is a fast but regular heart rhythm.   Follow-Up: At [facility], you and your health needs are our priority.  As part of our continuing mission to provide you with exceptional heart care, we have created designated Provider Care Teams.  These Care Teams include your primary Cardiologist (physician) and Advanced Practice Providers (APPs -  Physician Assistants and Nurse Practitioners) who all work together to provide you with the care you need, when you need it.  We recommend signing up for the patient portal called "MyChart".  Sign up information is provided on this After Visit Summary.  MyChart is used to connect with patients for Virtual Visits (Telemedicine).  Patients are able to view lab/test results, encounter notes, upcoming appointments, etc.  Non-urgent messages can be sent to your provider as well.   To learn  more about what you can do with MyChart, go to [facility].    Your next appointment:   3-4 week(s)  The format for your next appointment:   In Person  Provider:   You may see [email or web address], MD or one of the following Advanced Practice Providers on your designated Care Team:    Debbe Odea, NP  Nicolasa Ducking, PA-C  Eula Listen, PA-C  Marisue Ivan, NP  Other Instructions  Keep up the good work with diet and exercise! DASH Eating Plan DASH stands for "Dietary Approaches to Stop Hypertension." The DASH eating plan is a healthy eating plan that has been shown to reduce high blood pressure (hypertension). It may also reduce your risk for type 2 diabetes, heart disease, and stroke. The DASH eating plan may also help with weight loss. What are tips for following this plan?  General guidelines  Avoid eating more than 2,300 mg (milligrams) of salt (sodium) a day. If you have hypertension, you may need to reduce your sodium intake to 1,500 mg a day.  Limit alcohol intake to no more than 1 drink a day for nonpregnant women and 2 drinks a day for men. One drink equals 12 oz of beer, 5 oz of wine, or 1 oz of hard liquor.  Work with your health care provider to maintain a healthy body weight or to lose weight. Ask what an ideal weight is for you.  Get at least 30 minutes of exercise  that causes your heart to beat faster (aerobic exercise) most days of the week. Activities may include walking, swimming, or biking.  Work with your health care provider or diet and nutrition specialist (dietitian) to adjust your eating plan to your individual calorie needs. Reading food labels   Check food labels for the amount of sodium per serving. Choose foods with less than 5 percent of the Daily Value of sodium. Generally, foods with less than 300 mg of sodium per serving fit into this eating plan.  To find whole grains, look for the word "whole" as the first word in the  ingredient list. Shopping  Buy products labeled as "low-sodium" or "no salt added."  Buy fresh foods. Avoid canned foods and premade or frozen meals. Cooking  Avoid adding salt when cooking. Use salt-free seasonings or herbs instead of table salt or sea salt. Check with your health care provider or pharmacist before using salt substitutes.  Do not fry foods. Cook foods using healthy methods such as baking, boiling, grilling, and broiling instead.  Cook with heart-healthy oils, such as olive, canola, soybean, or sunflower oil. Meal planning  Eat a balanced diet that includes: ? 5 or more servings of fruits and vegetables each day. At each meal, try to fill half of your plate with fruits and vegetables. ? Up to 6-8 servings of whole grains each day. ? Less than 6 oz of lean meat, poultry, or fish each day. A 3-oz serving of meat is about the same size as a deck of cards. One egg equals 1 oz. ? 2 servings of low-fat dairy each day. ? A serving of nuts, seeds, or beans 5 times each week. ? Heart-healthy fats. Healthy fats called Omega-3 fatty acids are found in foods such as flaxseeds and coldwater fish, like sardines, salmon, and mackerel.  Limit how much you eat of the following: ? Canned or prepackaged foods. ? Food that is high in trans fat, such as fried foods. ? Food that is high in saturated fat, such as fatty meat. ? Sweets, desserts, sugary drinks, and other foods with added sugar. ? Full-fat dairy products.  Do not salt foods before eating.  Try to eat at least 2 vegetarian meals each week.  Eat more home-cooked food and less restaurant, buffet, and fast food.  When eating at a restaurant, ask that your food be prepared with less salt or no salt, if possible. What foods are recommended? The items listed may not be a complete list. Talk with your dietitian about what dietary choices are best for you. Grains Whole-grain or whole-wheat bread. Whole-grain or whole-wheat  pasta. Brown rice. Orpah Cobb. Bulgur. Whole-grain and low-sodium cereals. Pita bread. Low-fat, low-sodium crackers. Whole-wheat flour tortillas. Vegetables Fresh or frozen vegetables (raw, steamed, roasted, or grilled). Low-sodium or reduced-sodium tomato and vegetable juice. Low-sodium or reduced-sodium tomato sauce and tomato paste. Low-sodium or reduced-sodium canned vegetables. Fruits All fresh, dried, or frozen fruit. Canned fruit in natural juice (without added sugar). Meat and other protein foods Skinless chicken or [place]. Ground chicken or [place]. Pork with fat trimmed off. Fish and seafood. Egg whites. Dried beans, peas, or lentils. Unsalted nuts, nut butters, and seeds. Unsalted canned beans. Lean cuts of beef with fat trimmed off. Low-sodium, lean deli meat. Dairy Low-fat (1%) or fat-free (skim) milk. Fat-free, low-fat, or reduced-fat cheeses. Nonfat, low-sodium ricotta or cottage cheese. Low-fat or nonfat yogurt. Low-fat, low-sodium cheese. Fats and oils Soft margarine without trans fats. Vegetable oil. Low-fat, reduced-fat, or  light mayonnaise and salad dressings (reduced-sodium). Canola, safflower, olive, soybean, and sunflower oils. Avocado. Seasoning and other foods Herbs. Spices. Seasoning mixes without salt. Unsalted popcorn and pretzels. Fat-free sweets. What foods are not recommended? The items listed may not be a complete list. Talk with your dietitian about what dietary choices are best for you. Grains Baked goods made with fat, such as croissants, muffins, or some breads. Dry pasta or rice meal packs. Vegetables Creamed or fried vegetables. Vegetables in a cheese sauce. Regular canned vegetables (not low-sodium or reduced-sodium). Regular canned tomato sauce and paste (not low-sodium or reduced-sodium). Regular tomato and vegetable juice (not low-sodium or reduced-sodium). Rosita Fire. Olives. Fruits Canned fruit in a light or heavy syrup. Fried fruit. Fruit in cream or  butter sauce. Meat and other protein foods Fatty cuts of meat. Ribs. Fried meat. Tomasa Blase. Sausage. Bologna and other processed lunch meats. Salami. Fatback. Hotdogs. Bratwurst. Salted nuts and seeds. Canned beans with added salt. Canned or smoked fish. Whole eggs or egg yolks. Chicken or [place] with skin. Dairy Whole or 2% milk, cream, and half-and-half. Whole or full-fat cream cheese. Whole-fat or sweetened yogurt. Full-fat cheese. Nondairy creamers. Whipped toppings. Processed cheese and cheese spreads. Fats and oils Butter. Stick margarine. Lard. Shortening. Ghee. Bacon fat. Tropical oils, such as coconut, palm kernel, or palm oil. Seasoning and other foods Salted popcorn and pretzels. Onion salt, garlic salt, seasoned salt, table salt, and sea salt. Worcestershire sauce. Tartar sauce. Barbecue sauce. Teriyaki sauce. Soy sauce, including reduced-sodium. Steak sauce. Canned and packaged gravies. Fish sauce. Oyster sauce. Cocktail sauce. Horseradish that you find on the shelf. Ketchup. Mustard. Meat flavorings and tenderizers. Bouillon cubes. Hot sauce and Tabasco sauce. Premade or packaged marinades. Premade or packaged taco seasonings. Relishes. Regular salad dressings. Where to find more information:  National Heart, Lung, and Blood Institute: PopSteam.is  American Heart Association: www.heart.org Summary  The DASH eating plan is a healthy eating plan that has been shown to reduce high blood pressure (hypertension). It may also reduce your risk for type 2 diabetes, heart disease, and stroke.  With the DASH eating plan, you should limit salt (sodium) intake to 2,300 mg a day. If you have hypertension, you may need to reduce your sodium intake to 1,500 mg a day.  When on the DASH eating plan, aim to eat more fresh fruits and vegetables, whole grains, lean proteins, low-fat dairy, and heart-healthy fats.  Work with your health care provider or diet and nutrition specialist (dietitian) to  adjust your eating plan to your individual calorie needs. This information is not intended to replace advice given to you by your health care provider. Make sure you discuss any questions you have with your health care provider. Document Revised: 10/16/2017 Document Reviewed: 10/27/2016 Elsevier Patient Education  2020 [employer].

## 2020-07-03 DIAGNOSIS — R7989 Other specified abnormal findings of blood chemistry: Secondary | ICD-10-CM | POA: Insufficient documentation

## 2020-07-03 DIAGNOSIS — I10 Essential (primary) hypertension: Secondary | ICD-10-CM | POA: Insufficient documentation

## 2020-07-26 ENCOUNTER — Ambulatory Visit: Payer: 59 | Admitting: Cardiology

## 2020-07-27 ENCOUNTER — Encounter: Payer: Self-pay | Admitting: Cardiology

## 2020-11-19 ENCOUNTER — Other Ambulatory Visit: Payer: Self-pay | Admitting: *Deleted

## 2020-11-19 MED ORDER — HYDROCHLOROTHIAZIDE 25 MG PO TABS: 25.0000 mg | ORAL_TABLET | Freq: Every day | ORAL | 0 refills | Status: DC

## 2020-11-19 NOTE — Telephone Encounter (Signed)
RX SENT TO PHARMACY #15 WITH NOTATION FOR PATIENT TO CONTACT OFFICE FOR APPOINTMENT.

## 2020-12-11 IMAGING — CT CT HEAD W/O CM
2 series · 16 of 37 positions shown, 20 images · non-contrast
Comparison: No pertinent prior studies available for comparison.

CLINICAL DATA: Possible stroke; headache, acute, normal neuro exam.
Additional provided: Patient reports numbness to mouth, unable to
fully close left eye.

EXAM:
CT HEAD WITHOUT CONTRAST
TECHNIQUE: Contiguous axial images were obtained from the base of the skull
through the vertex without intravenous contrast.

[Series 3: head wo · axial · 0.40mm/px · z∈[-67,+53]mm · 13 of 27 slices shown, 17 images]
[im 2/27  brain]
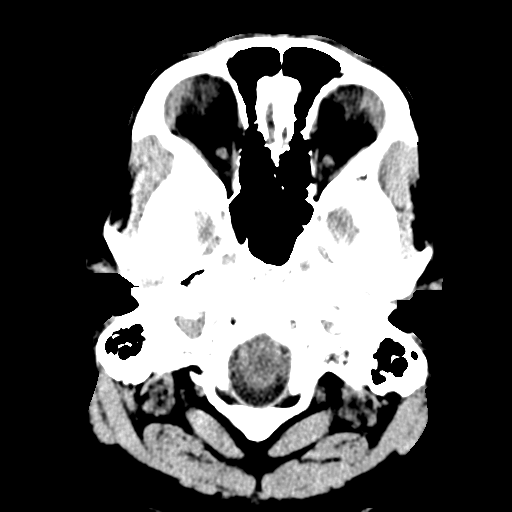
[im 2/27  bone]
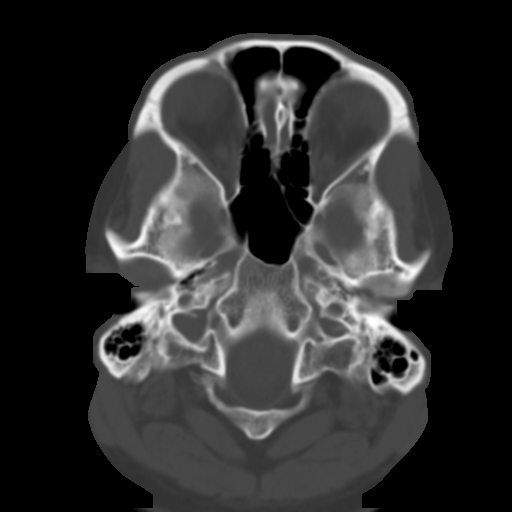
[im 4/27  brain]
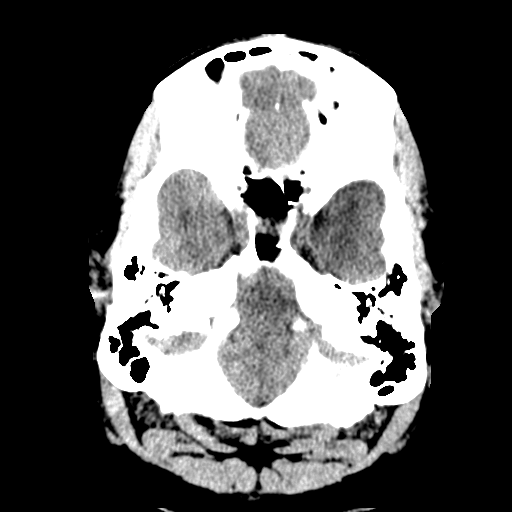
[im 6/27  brain]
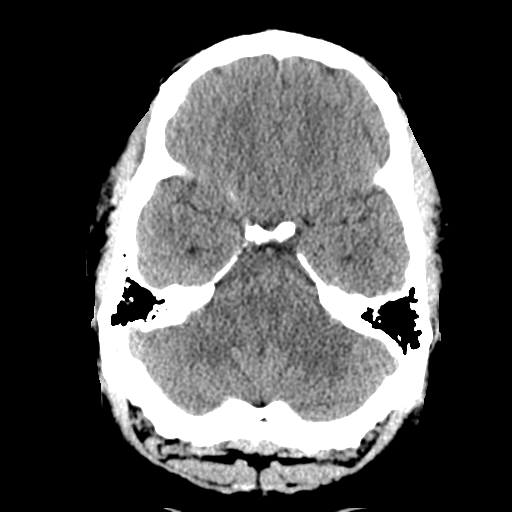
[im 8/27  brain]
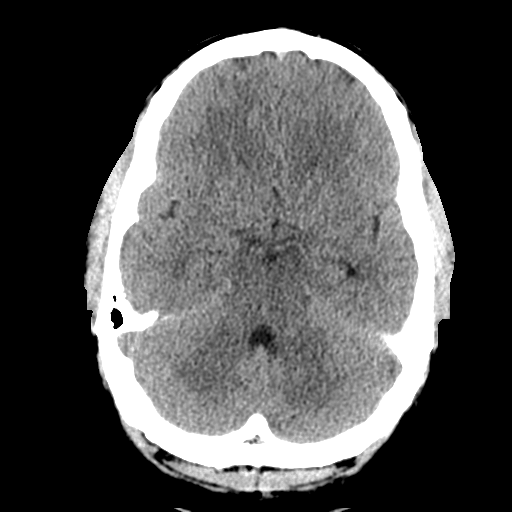
[im 10/27  brain]
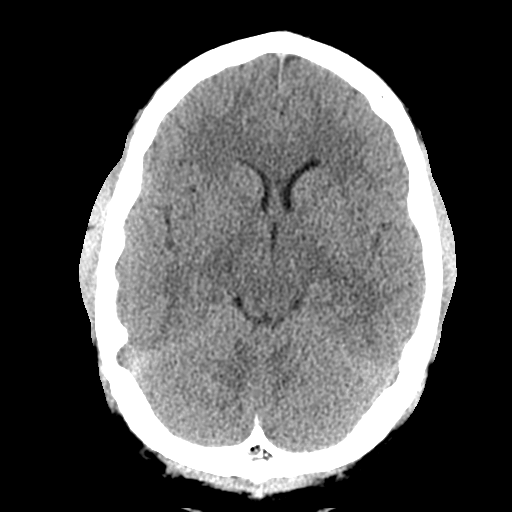
[im 10/27  bone]
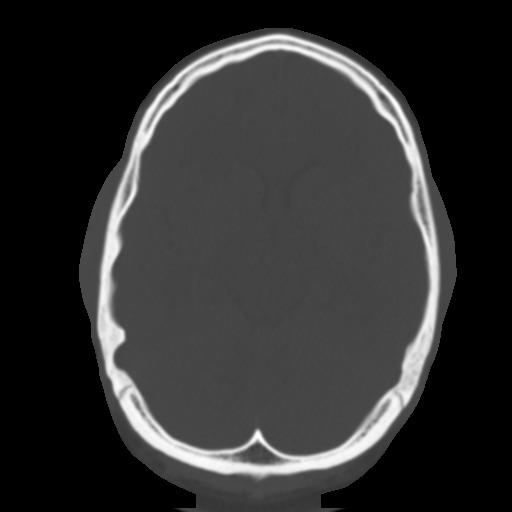
[im 12/27  brain]
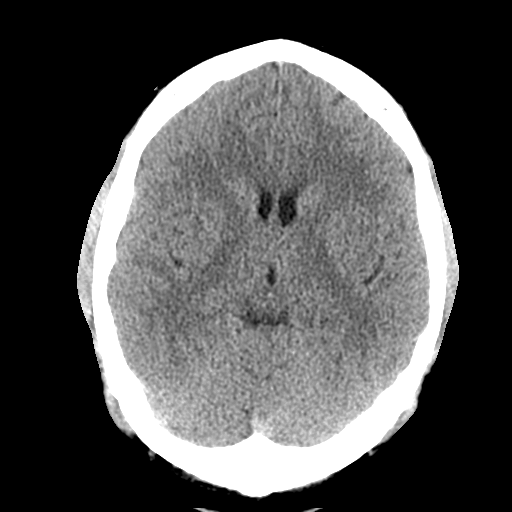
[im 14/27  brain]
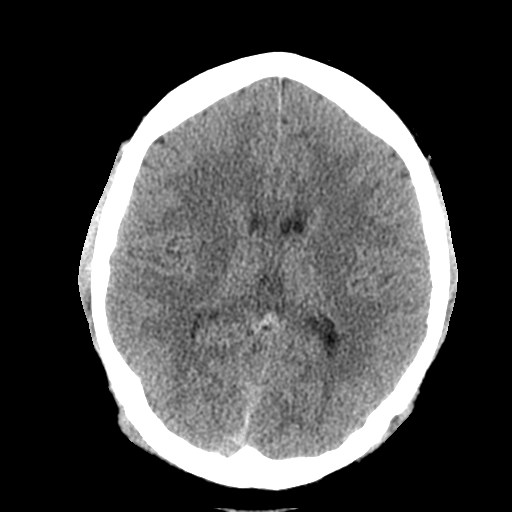
[im 16/27  brain]
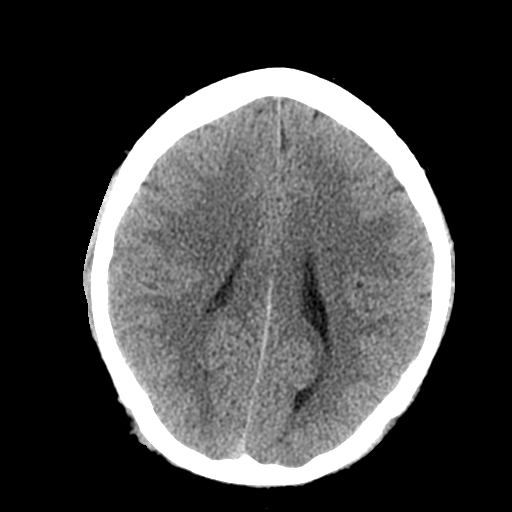
[im 18/27  brain]
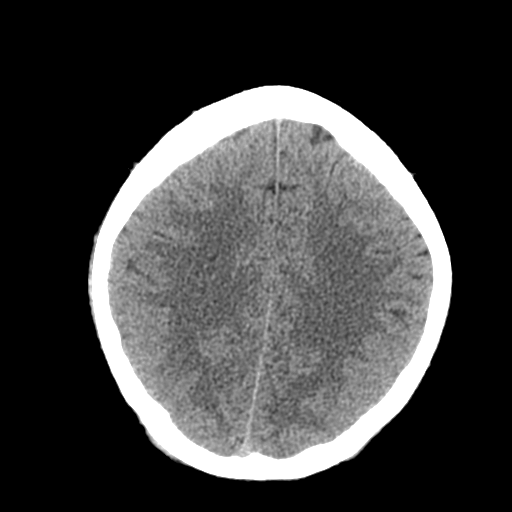
[im 18/27  bone]
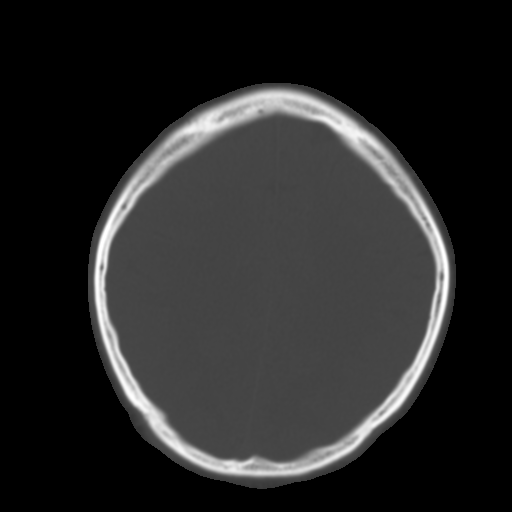
[im 20/27  brain]
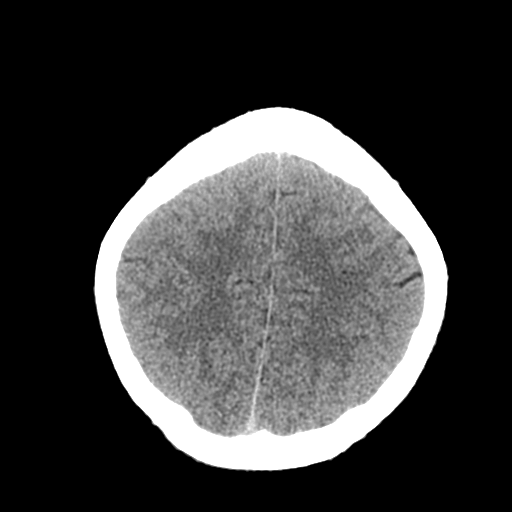
[im 22/27  brain]
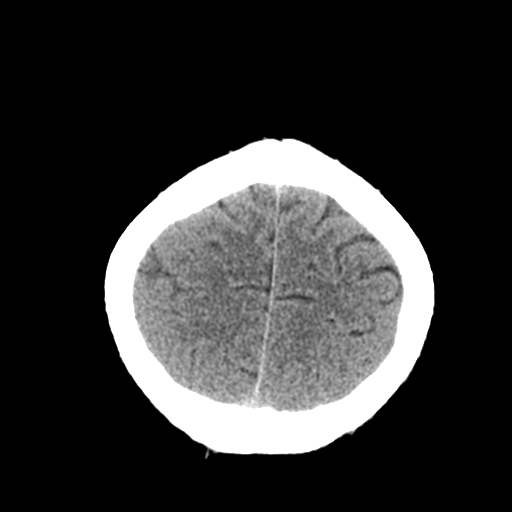
[im 24/27  brain]
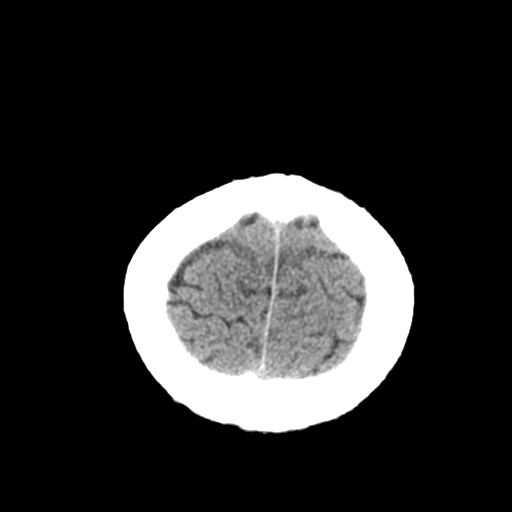
[im 26/27  brain]
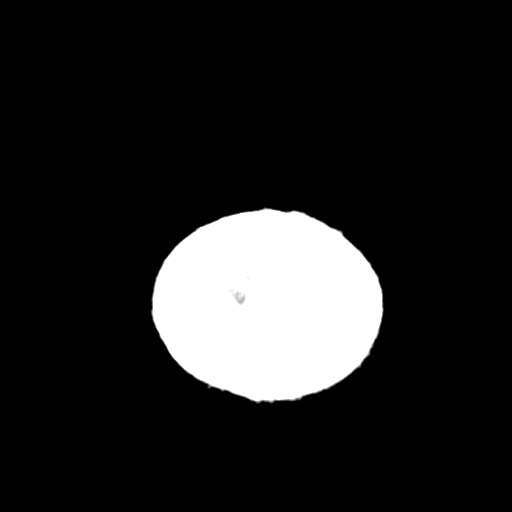
[im 26/27  bone]
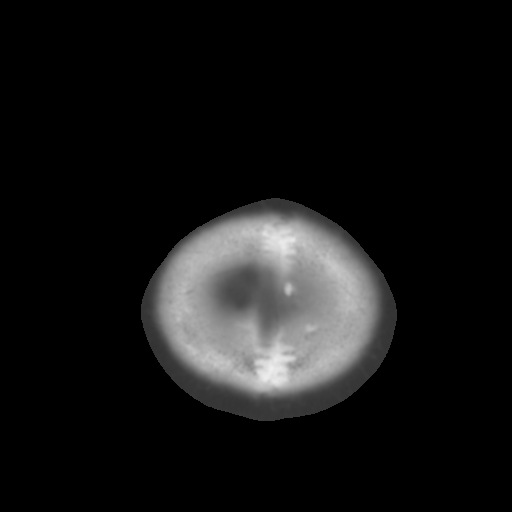

[Series 4: coronal soft tissue · coronal · 0.32mm/px · 3 of 63 slices shown]
[im 21/63  brain]
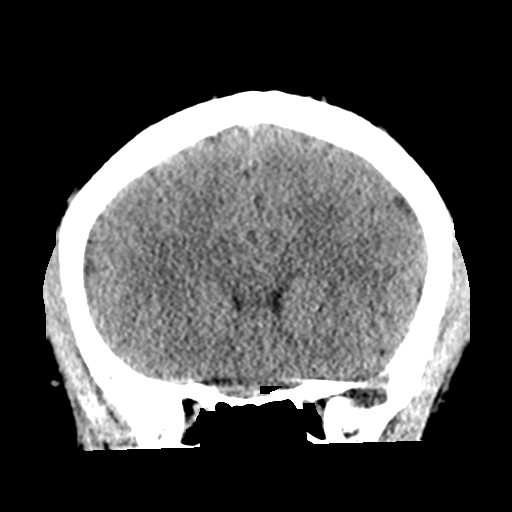
[im 28/63  brain]
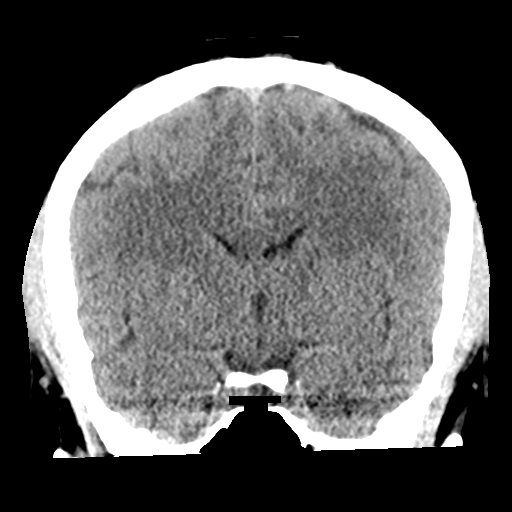
[im 35/63  brain]
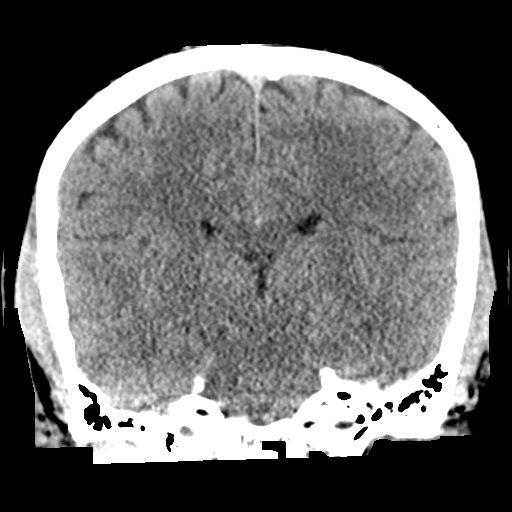

[16 of 37 positions shown; findings below may reference images not displayed]

FINDINGS: Brain:

Cerebral volume is normal.

There is no acute intracranial hemorrhage.

No demarcated cortical infarct.

No extra-axial fluid collection.

No evidence of intracranial mass.

No midline shift.

Vascular: No hyperdense vessel.

Skull: Normal. Negative for fracture or focal lesion.

Sinuses/Orbits: Visualized orbits show no acute finding. No
significant paranasal sinus disease or mastoid effusion at the
imaged levels.
IMPRESSION: Unremarkable non-contrast CT appearance of the brain. No evidence of
acute intracranial abnormality.

## 2020-12-24 ENCOUNTER — Other Ambulatory Visit: Payer: Self-pay

## 2020-12-24 ENCOUNTER — Other Ambulatory Visit: Payer: Self-pay | Admitting: Family

## 2020-12-24 DIAGNOSIS — I10 Essential (primary) hypertension: Secondary | ICD-10-CM

## 2020-12-24 MED ORDER — AMLODIPINE BESYLATE 10 MG PO TABS: 10.0000 mg | ORAL_TABLET | Freq: Every day | ORAL | 0 refills | Status: DC

## 2021-01-04 ENCOUNTER — Other Ambulatory Visit: Payer: Self-pay

## 2021-01-04 ENCOUNTER — Ambulatory Visit (INDEPENDENT_AMBULATORY_CARE_PROVIDER_SITE_OTHER): Payer: BC Managed Care – PPO | Admitting: Cardiology

## 2021-01-04 ENCOUNTER — Encounter: Payer: Self-pay | Admitting: Cardiology

## 2021-01-04 VITALS — BP 172/80 | HR 93 | Ht 70.0 in | Wt 337.0 lb

## 2021-01-04 DIAGNOSIS — I10 Essential (primary) hypertension: Secondary | ICD-10-CM | POA: Diagnosis not present

## 2021-01-04 MED ORDER — HYDROCHLOROTHIAZIDE 25 MG PO TABS: 25.0000 mg | ORAL_TABLET | Freq: Every day | ORAL | 1 refills | Status: DC

## 2021-01-04 MED ORDER — LOSARTAN POTASSIUM 25 MG PO TABS: 25.0000 mg | ORAL_TABLET | Freq: Every day | ORAL | 1 refills | Status: DC

## 2021-01-04 MED ORDER — AMLODIPINE BESYLATE 10 MG PO TABS: 10.0000 mg | ORAL_TABLET | Freq: Every day | ORAL | 1 refills | Status: DC

## 2021-01-04 NOTE — Patient Instructions (Signed)
Medication Instructions:  Your physician recommends that you continue on your current medications as directed. Please refer to the Current Medication list given to you today.  Your medications have been refilled.  *If you need a refill on your cardiac medications before your next appointment, please call your pharmacy*   Lab Work: None ordered If you have labs (blood work) drawn today and your tests are completely normal, you will receive your results only by: Marland Kitchen MyChart Message (if you have MyChart) OR . A paper copy in the mail If you have any lab test that is abnormal or we need to change your treatment, we will call you to review the results.   Testing/Procedures: None ordered   Follow-Up: At [facility], you and your health needs are our priority.  As part of our continuing mission to provide you with exceptional heart care, we have created designated Provider Care Teams.  These Care Teams include your primary Cardiologist (physician) and Advanced Practice Providers (APPs -  Physician Assistants and Nurse Practitioners) who all work together to provide you with the care you need, when you need it.  We recommend signing up for the patient portal called "MyChart".  Sign up information is provided on this After Visit Summary.  MyChart is used to connect with patients for Virtual Visits (Telemedicine).  Patients are able to view lab/test results, encounter notes, upcoming appointments, etc.  Non-urgent messages can be sent to your provider as well.   To learn more about what you can do with MyChart, go to [email or web address].    Your next appointment:   3 month(s)  The format for your next appointment:   In Person  Provider:   You may see Debbe Odea, MD or one of the following Advanced Practice Providers on your designated Care Team:    Nicolasa Ducking, NP  Eula Listen, PA-C  Marisue Ivan, PA-C  Cadence Violet, [place]  Gillian Shields, NP    Other  Instructions N/A

## 2021-01-04 NOTE — Progress Notes (Signed)
Cardiology Office Note:    Date:  01/04/2021   ID:  Randall Morrison, DOB [birth year], MRN [id number]  PCP:  Patient, No Pcp Per  [facility] Cardiologist:  Debbe Odea, MD  [facility] Electrophysiologist:  None   Referring MD: No ref. provider found   Chief Complaint  Patient presents with  . Other    Past due 3-4 week follow up - patient has no complaints. Meds reviewed verbally with patient.      History of Present Illness:    Randall Morrison is a 35 y.o. male with a hx of hypertension who presents for follow-up.    Patient being seen for hypertension.  Medications are being titrated, losartan was started after last clinic visit.  BPs at home overall improved but slightly still elevated.  Systolics run in the 130s to 140s.  Patient states not taking his medications as prescribed, missing/forgetting to take medicines about 2 to 3 days in a week.    Past Medical History:  Diagnosis Date  . Elevated LFTs 02/18/2018  . Hypertension     Past Surgical History:  Procedure Laterality Date  . TONSILLECTOMY Bilateral 1995    Current Medications: Current Meds  Medication Sig  . [DISCONTINUED] amLODipine (NORVASC) 10 MG tablet Take 1 tablet (10 mg total) by mouth daily. Please schedule office visit for further refills. Thank you!  . [DISCONTINUED] hydrochlorothiazide (HYDRODIURIL) 25 MG tablet Take 1 tablet (25 mg total) by mouth daily for 15 days. PLEASE CALL OFFICE TO SCHEDULE APPOINTMENT FOR FURTHER REFILLS  . [DISCONTINUED] losartan (COZAAR) 25 MG tablet TAKE 1 TABLET BY MOUTH EVERY DAY     Allergies:   Patient has no known allergies.   Social History   Socioeconomic History  . Marital status: Single    Spouse name: Not on file  . Number of children: Not on file  . Years of education: Not on file  . Highest education level: Not on file  Occupational History  . Not on file  Tobacco Use  . Smoking status: Never Smoker  . Smokeless tobacco: Never  Used  Vaping Use  . Vaping Use: Never used  Substance and Sexual Activity  . Alcohol use: Yes    Comment: Social  . Drug use: Never  . Sexual activity: Not on file  Other Topics Concern  . Not on file  Social History Narrative  . Not on file   Social Determinants of Health   Financial Resource Strain: Not on file  Food Insecurity: Not on file  Transportation Needs: Not on file  Physical Activity: Not on file  Stress: Not on file  Social Connections: Not on file     Family History: The patient's family history includes Heart disease in his maternal grandmother; Hypertension in his mother.  ROS:   Please see the history of present illness.     All other systems reviewed and are negative.  EKGs/Labs/Other Studies Reviewed:    The following studies were reviewed today:   EKG:  EKG is  ordered today.  The ekg ordered today demonstrates sinus tachycardia otherwise normal   Recent Labs: 03/29/2020: TSH 3.380 05/29/2020: ALT 38; BUN 15; Creatinine, Ser 1.27; Hemoglobin 14.4; Platelets 251; Potassium 4.0; Sodium 137  Recent Lipid Panel    Component Value Date/Time   CHOL 186 03/29/2020 0911   TRIG 226 (H) 03/29/2020 0911   HDL 44 03/29/2020 0911   CHOLHDL 4.2 03/29/2020 0911   LDLCALC 103 (H) 03/29/2020 0911  Physical Exam:    VS:  BP (!) 172/80 (BP Location: Left Arm, Patient Position: Sitting, Cuff Size: Normal)   Pulse 93   Ht 5\' 10"  (1.778 m)   Wt (!) 337 lb (152.9 kg)   BMI 48.35 kg/m     Wt Readings from Last 3 Encounters:  01/04/21 (!) 337 lb (152.9 kg)  06/25/20 (!) 330 lb (149.7 kg)  06/11/20 (!) 333 lb 4 oz (151.2 kg)     GEN:  Well nourished, well developed in no acute distress HEENT: Normal NECK: No JVD; No carotid bruits LYMPHATICS: No lymphadenopathy CARDIAC: RRR, no murmurs, rubs, gallops RESPIRATORY:  Clear to auscultation without rales, wheezing or rhonchi  ABDOMEN: Soft, non-tender, non-distended MUSCULOSKELETAL:  No edema; No deformity   SKIN: Warm and dry NEUROLOGIC:  Alert and oriented x 3  PSYCHIATRIC:  Normal affect   ASSESSMENT:    1. Essential hypertension   2. Morbid obesity (HCC)    PLAN:    In order of problems listed above:  1. Hypertension, BP still elevated.  Continue losartan, HCTZ and amlodipine at current doses.  Patient counseled to take medications as prescribed.  Low-salt diet advised.  If BP stays elevated with medication compliance, consider increasing dose of losartan. 2. Patient is morbidly obese, weight loss and low calorie diet advised.  Follow-up in 3 months for BP management  Total encounter time more than 35 minutes  Greater than 50% was spent in counseling and coordination of care with the patient, including medication management.    Medication Adjustments/Labs and Tests Ordered: Current medicines are reviewed at length with the patient today.  Concerns regarding medicines are outlined above.  Orders Placed This Encounter  Procedures  . EKG 12-Lead   Meds ordered this encounter  Medications  . amLODipine (NORVASC) 10 MG tablet    Sig: Take 1 tablet (10 mg total) by mouth daily.    Dispense:  90 tablet    Refill:  1  . hydrochlorothiazide (HYDRODIURIL) 25 MG tablet    Sig: Take 1 tablet (25 mg total) by mouth daily.    Dispense:  90 tablet    Refill:  1  . losartan (COZAAR) 25 MG tablet    Sig: Take 1 tablet (25 mg total) by mouth daily.    Dispense:  90 tablet    Refill:  1    Patient Instructions  Medication Instructions:  Your physician recommends that you continue on your current medications as directed. Please refer to the Current Medication list given to you today.  Your medications have been refilled.  *If you need a refill on your cardiac medications before your next appointment, please call your pharmacy*   Lab Work: None ordered If you have labs (blood work) drawn today and your tests are completely normal, you will receive your results only by: 06/13/20 MyChart  Message (if you have MyChart) OR . A paper copy in the mail If you have any lab test that is abnormal or we need to change your treatment, we will call you to review the results.   Testing/Procedures: None ordered   Follow-Up: At [facility], you and your health needs are our priority.  As part of our continuing mission to provide you with exceptional heart care, we have created designated Provider Care Teams.  These Care Teams include your primary Cardiologist (physician) and Advanced Practice Providers (APPs -  Physician Assistants and Nurse Practitioners) who all work together to provide you with the care you need,  when you need it.  We recommend signing up for the patient portal called "MyChart".  Sign up information is provided on this After Visit Summary.  MyChart is used to connect with patients for Virtual Visits (Telemedicine).  Patients are able to view lab/test results, encounter notes, upcoming appointments, etc.  Non-urgent messages can be sent to your provider as well.   To learn more about what you can do with MyChart, go to [email or web address].    Your next appointment:   3 month(s)  The format for your next appointment:   In Person  Provider:   You may see Debbe Odea, MD or one of the following Advanced Practice Providers on your designated Care Team:    Nicolasa Ducking, NP  Eula Listen, PA-C  Marisue Ivan, PA-C  Cadence Wolbach, [place]  Gillian Shields, NP    Other Instructions N/A     Signed, Debbe Odea, MD  01/04/2021 10:28 AM    [facility]

## 2021-04-04 ENCOUNTER — Ambulatory Visit: Payer: BC Managed Care – PPO | Admitting: Cardiology

## 2021-05-17 ENCOUNTER — Ambulatory Visit: Payer: BC Managed Care – PPO | Admitting: Physician Assistant

## 2021-05-17 ENCOUNTER — Encounter: Payer: Self-pay | Admitting: Physician Assistant

## 2021-05-17 ENCOUNTER — Other Ambulatory Visit: Payer: Self-pay

## 2021-05-17 VITALS — BP 180/90 | HR 121 | Ht 70.0 in | Wt 338.0 lb

## 2021-05-17 DIAGNOSIS — Z79899 Other long term (current) drug therapy: Secondary | ICD-10-CM

## 2021-05-17 DIAGNOSIS — I10 Essential (primary) hypertension: Secondary | ICD-10-CM

## 2021-05-17 DIAGNOSIS — R000 Tachycardia, unspecified: Secondary | ICD-10-CM | POA: Diagnosis not present

## 2021-05-17 DIAGNOSIS — M7989 Other specified soft tissue disorders: Secondary | ICD-10-CM

## 2021-05-17 DIAGNOSIS — G4730 Sleep apnea, unspecified: Secondary | ICD-10-CM

## 2021-05-17 DIAGNOSIS — N179 Acute kidney failure, unspecified: Secondary | ICD-10-CM | POA: Diagnosis not present

## 2021-05-17 MED ORDER — LOSARTAN POTASSIUM 50 MG PO TABS: 50.0000 mg | ORAL_TABLET | Freq: Every day | ORAL | 3 refills | Status: DC

## 2021-05-17 NOTE — Patient Instructions (Addendum)
Medication Instructions:  Your physician has recommended you make the following change in your medication:   INCREASE Losartan to 50 mg once daily   *If you need a refill on your cardiac medications before your next appointment, please call your pharmacy*   Lab Work: BMET, CBC, TSH today  If you have labs (blood work) drawn today and your tests are completely normal, you will receive your results only by: MyChart Message (if you have MyChart) OR A paper copy in the mail If you have any lab test that is abnormal or we need to change your treatment, we will call you to review the results.   Testing/Procedures: WatchPAT?  Is a FDA cleared portable home sleep study test that uses a watch and 3 points of contact to monitor 7 different channels, including your heart rate, oxygen saturations, body position, snoring, and chest motion.  The study is easy to use from the comfort of your own home and accurately detect sleep apnea.  Before bed, you attach the chest sensor, attached the sleep apnea bracelet to your nondominant hand, and attach the finger probe.  After the study, the raw data is downloaded from the watch and scored for apnea events.   For more information: [email or web address]    Follow-Up: At [facility], you and your health needs are our priority.  As part of our continuing mission to provide you with exceptional heart care, we have created designated Provider Care Teams.  These Care Teams include your primary Cardiologist (physician) and Advanced Practice Providers (APPs -  Physician Assistants and Nurse Practitioners) who all work together to provide you with the care you need, when you need it.   Your next appointment:   2 month(s)  The format for your next appointment:   In Person  Provider:   Debbe Odea, MD or Eula Listen, PA-C

## 2021-05-17 NOTE — Progress Notes (Signed)
Cardiology Office Note    Date:  05/17/2021   ID:  Randall Morrison, DOB 1986/06/22, MRN [id number]  PCP:  Patient, No Pcp Per (Inactive)  Cardiologist:  Debbe Odea, MD  Electrophysiologist:  None   Chief Complaint: Follow-up  History of Present Illness:   Randall Morrison is a 35 y.o. male with history of HTN, Bell's palsy, elevated LFTs, and morbid obesity who presents for follow-up of hypertension.  He established care with Dr. Azucena Cecil in 04/2020 for evaluation of an abnormal EKG.  Prior outpatient EKG was read at atrial flutter with cardiology interpretation showing sinus tachycardia.  With regards to his hypertension, he was started on HCTZ 25 mg and continued on amlodipine 5 mg.  Since that visit, he has been seen several times with escalation of medical therapy.  He was last seen in 12/2020 with a BP of 172/80.  Home BPs were in the 130s to 140s.  He indicated he would miss or forget to take medications approximately 2 to 3 days/week.  He was advised to take medications as directed and eat a low-salt diet.  No changes were made in his medical therapy and he was continued on amlodipine 10 mg, HCTZ 25 mg, and losartan 25 mg.  He comes in doing reasonably well from a cardiac perspective.  Since he was last seen he did not check his blood pressure very regularly up until the past week.  He notes home readings are frequently in the 140s over 80s.  He did run out of HCTZ though has been back on this.  He notes a history of whitecoat hypertension.  He occasionally drinks alcohol, though only socially and does not overindulge.  No illicit substances.  He minimizes caffeine intake though will drink some tea.  He is unaware of hypertension in other family members at a young age.  No chest pain, dyspnea, palpitations, dizziness, presyncope, or syncope.  He does note some issues with the ED recently.  Mild lower extremity swelling bilaterally.  No recent sedentary periods, vascular  trauma, or hypercoagulable state.   Labs independently reviewed: 05/2020 - potassium 4.0, BUN 15, serum creatinine 1.27, albumin 4.6, AST/ALT normal, Hgb 14.4, PLT 251 03/2020 - TC 186, TG 226, HDL 44, LDL 103, TSH normal    Sleep Apnea Evaluation          STOP-BANG RISK ASSESSMENT    STOP-BANG 05/17/2021  Do you snore loudly? Yes  Has anyone observed you stop breathing during sleep? Yes  Do you have (or are you being treated for) high blood pressure? Yes  Recent BMI (Calculated) 48.5  Is BMI greater than 35 kg/m2? 1=Yes  Age older than 35 years old? 0=No  Has large neck size > 40 cm (15.7 in, large male shirt size, large male collar size > 16) Yes  Gender - Male 1=Yes        Past Medical History:  Diagnosis Date   Elevated LFTs 02/18/2018   Hypertension     Past Surgical History:  Procedure Laterality Date   TONSILLECTOMY Bilateral 1995    Current Medications: Current Meds  Medication Sig   amLODipine (NORVASC) 10 MG tablet Take 1 tablet (10 mg total) by mouth daily.   hydrochlorothiazide (HYDRODIURIL) 25 MG tablet Take 1 tablet (25 mg total) by mouth daily.   losartan (COZAAR) 50 MG tablet Take 1 tablet (50 mg total) by mouth daily.   [DISCONTINUED] losartan (COZAAR) 25 MG tablet Take 1 tablet (25 mg total) by  mouth daily.    Allergies:   Patient has no known allergies.   Social History   Socioeconomic History   Marital status: Single    Spouse name: Not on file   Number of children: Not on file   Years of education: Not on file   Highest education level: Not on file  Occupational History   Not on file  Tobacco Use   Smoking status: Never   Smokeless tobacco: Never  Vaping Use   Vaping Use: Never used  Substance and Sexual Activity   Alcohol use: Yes    Comment: Social   Drug use: Never   Sexual activity: Not on file  Other Topics Concern   Not on file  Social History Narrative   Not on file   Social Determinants of Health   Financial Resource  Strain: Not on file  Food Insecurity: Not on file  Transportation Needs: Not on file  Physical Activity: Not on file  Stress: Not on file  Social Connections: Not on file     Family History:  The patient's family history includes Heart disease in his maternal grandmother; Hypertension in his mother.  ROS:   Review of Systems  Constitutional:  Positive for malaise/fatigue. Negative for chills, diaphoresis, fever and weight loss.  HENT:  Negative for congestion.   Eyes:  Negative for discharge and redness.  Respiratory:  Negative for cough, sputum production, shortness of breath and wheezing.   Cardiovascular:  Positive for leg swelling. Negative for chest pain, palpitations, orthopnea, claudication and PND.  Gastrointestinal:  Negative for abdominal pain, heartburn, nausea and vomiting.  Genitourinary:        Erectile dysfunction  Musculoskeletal:  Negative for falls and myalgias.  Skin:  Negative for rash.  Neurological:  Negative for dizziness, tingling, tremors, sensory change, speech change, focal weakness, loss of consciousness and weakness.  Endo/Heme/Allergies:  Does not bruise/bleed easily.  Psychiatric/Behavioral:  Negative for substance abuse. The patient is not nervous/anxious.   All other systems reviewed and are negative.   EKGs/Labs/Other Studies Reviewed:    Studies reviewed were summarized above. The additional studies were reviewed today:  N/A.  EKG:  EKG is ordered today.  The EKG ordered today demonstrates sinus tachycardia, 126 bpm, no acute ST-T changes  Recent Labs: 05/29/2020: ALT 38; BUN 15; Creatinine, Ser 1.27; Hemoglobin 14.4; Platelets 251; Potassium 4.0; Sodium 137  Recent Lipid Panel    Component Value Date/Time   CHOL 186 03/29/2020 0911   TRIG 226 (H) 03/29/2020 0911   HDL 44 03/29/2020 0911   CHOLHDL 4.2 03/29/2020 0911   LDLCALC 103 (H) 03/29/2020 0911    PHYSICAL EXAM:    VS:  BP (!) 180/90 (BP Location: Left Arm, Patient Position:  Sitting, Cuff Size: Large)   Pulse (!) 121   Ht 5\' 10"  (1.778 m)   Wt (!) 338 lb (153.3 kg)   SpO2 98%   BMI 48.50 kg/m   BMI: Body mass index is 48.5 kg/m.  Physical Exam Vitals reviewed.  Constitutional:      Appearance: He is well-developed.  HENT:     Head: Normocephalic and atraumatic.  Eyes:     General:        Right eye: No discharge.        Left eye: No discharge.  Neck:     Vascular: No JVD.  Cardiovascular:     Rate and Rhythm: Regular rhythm. Tachycardia present.     Pulses:  Dorsalis pedis pulses are 2+ on the right side and 2+ on the left side.       Posterior tibial pulses are 2+ on the right side and 2+ on the left side.     Heart sounds: Normal heart sounds, S1 normal and S2 normal. Heart sounds not distant. No midsystolic click and no opening snap. No murmur heard.   No friction rub.  Pulmonary:     Effort: Pulmonary effort is normal. No respiratory distress.     Breath sounds: Normal breath sounds. No decreased breath sounds, wheezing or rales.  Chest:     Chest wall: No tenderness.  Abdominal:     General: There is no distension.     Palpations: Abdomen is soft.     Tenderness: There is no abdominal tenderness.  Musculoskeletal:     Cervical back: Normal range of motion.     Right lower leg: Edema present.     Left lower leg: Edema present.     Comments: Trace bilateral pretibial edema.  No evidence of warmth, erythema, or cording.  Skin:    General: Skin is warm and dry.     Nails: There is no clubbing.  Neurological:     Mental Status: He is alert and oriented to person, place, and time.  Psychiatric:        Speech: Speech normal.        Behavior: Behavior normal.        Thought Content: Thought content normal.        Judgment: Judgment normal.    Wt Readings from Last 3 Encounters:  05/17/21 (!) 338 lb (153.3 kg)  01/04/21 (!) 337 lb (152.9 kg)  06/25/20 (!) 330 lb (149.7 kg)     ASSESSMENT & PLAN:    HTN: Blood pressure  remains suboptimally controlled and largely unchanged when compared to his visit in 12/2020 with a BP of 180/98.  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  He reports BP at home is typically in the 140s over 80s.  I have asked him to bring his BP cuff by to compare with ours.  Titrate losartan to 50 mg daily.  Otherwise continue amlodipine 10 mg and HCTZ 25 mg.  Schedule sleep study.  If sleep study is unrevealing, or if with adequate therapy of potential sleep apnea his BP remains elevated, consider further work-up of his hypertension including renal artery ultrasound, plasma renin aldosterone ratio, cortisol, and catecholamines/metanephrines.  Morbid obesity/lower extremity swelling: Weight loss is encouraged through heart healthy diet and regular exercise.  No evidence of significant volume overload.  Some of his lower extremity swelling may be exacerbated by amlodipine.  Depending on trend, may need to consider alternative antihypertensive.  AKI: Last serum creatinine from 05/2020 elevated at 1.27.  Cannot exclude some degree of medical renal disease in the setting of poorly controlled hypertension.  Check BMP.  Sinus tachycardia: No history to suggest venous thromboembolism.  Pulse ox 98% on room air without symptoms of dyspnea or chest pain.  Likely in the setting of obesity with physical deconditioning with underlying anxiety at his office visit.  Check BMP, TSH, and CBC.  Sleep disordered breathing: STOP-BANG as outlined above.  Schedule sleep study.   Disposition: F/u with Dr. Azucena Cecil or an APP in 2 months.   Medication Adjustments/Labs and Tests Ordered: Current medicines are reviewed at length with the patient today.  Concerns regarding medicines are outlined above. Medication  changes, Labs and Tests ordered today are summarized above and listed in the Patient Instructions accessible in Encounters.   Signed, Eula Listen,  PA-C 05/17/2021 4:01 PM     [facility] [place] [address] [place], [place] [address] [phone]

## 2021-05-18 LAB — BASIC METABOLIC PANEL
BUN/Creatinine Ratio: 14 (ref 9–20)
BUN: 15 mg/dL (ref 6–20)
CO2: 24 mmol/L (ref 20–29)
Calcium: 10 mg/dL (ref 8.7–10.2)
Chloride: 100 mmol/L (ref 96–106)
Creatinine, Ser: 1.1 mg/dL (ref 0.76–1.27)
Glucose: 116 mg/dL — ABNORMAL HIGH (ref 65–99)
Potassium: 3.9 mmol/L (ref 3.5–5.2)
Sodium: 137 mmol/L (ref 134–144)
eGFR: 90 mL/min/{1.73_m2} (ref >59)

## 2021-05-18 LAB — CBC
Hematocrit: 40.7 % (ref 37.5–51.0)
Hemoglobin: 13.5 g/dL (ref 13.0–17.7)
MCH: 27.1 pg (ref 26.6–33.0)
MCHC: 33.2 g/dL (ref 31.5–35.7)
MCV: 82 fL (ref 79–97)
Platelets: 235 10*3/uL (ref 150–450)
RBC: 4.98 x10E6/uL (ref 4.14–5.80)
RDW: 12 % (ref 11.6–15.4)
WBC: 6 10*3/uL (ref 3.4–10.8)

## 2021-05-18 LAB — TSH: TSH: 2.88 u[IU]/mL (ref 0.450–4.500)

## 2021-05-23 ENCOUNTER — Telehealth: Payer: Self-pay | Admitting: *Deleted

## 2021-05-23 NOTE — Telephone Encounter (Signed)
Left voicemail message that he can proceed with sleep device and pin number is [id number] with instructions to call back. Will also send My Chart message.

## 2021-05-23 NOTE — Telephone Encounter (Signed)
Per [employer] portal no PA is required. Ok to activate PIN. Message will be routed back to Randall Morrison to notify patient.

## 2021-05-23 NOTE — Telephone Encounter (Signed)
Spoke with patient and he did get messages and verbalized understanding. No further questions at this time.

## 2021-05-23 NOTE — Telephone Encounter (Signed)
-----   Message from Bryna Colander, RN sent at 05/22/2021  4:46 PM EDT ----- Regarding: Marcina Millard ordered for this patient.  Please authorize and let me know when patient may proceed  StopBang was 7 Ordered by [employer]

## 2021-06-19 ENCOUNTER — Ambulatory Visit: Payer: Self-pay | Admitting: Internal Medicine

## 2021-07-16 NOTE — Progress Notes (Deleted)
Cardiology Office Note    Date:  07/16/2021   ID:  Randall Morrison, DOB 01-23-1986, MRN [id number]  PCP:  Patient, No Pcp Per (Inactive)  Cardiologist:  Debbe Odea, MD  Electrophysiologist:  None   Chief Complaint: Follow-up  History of Present Illness:   Randall Morrison is a 35 y.o. male with history of HTN, Bell's palsy, elevated LFTs, sinus tachycardia, and morbid obesity who presents for follow-up of hypertension.   He established care with Dr. Azucena Cecil in 04/2020 for evaluation of an abnormal EKG.  Prior outpatient EKG was read at atrial flutter with cardiology interpretation showing sinus tachycardia.  With regards to his hypertension, he was started on HCTZ 25 mg and continued on amlodipine 5 mg.  Since that visit, he has been seen several times with escalation of medical therapy.  He was last seen in 05/2021 and reported home BP readings were frequently in the 140s over 80s, though he had not been regularly checking his BP.  He did briefly run out of HCTZ, though was back on this medication at his visit.  He reported a history of whitecoat hypertension.  BP was elevated at 180/90.  Sinus tachycardia persisted.  He was scheduled for a WatchPAT sleep study with results pending at this time.  Losartan was titrated to 50 mg, he was otherwise continued on amlodipine 10 mg and HCTZ 25 mg.  We asked him to bring inhis BP cuff for comparison.    ***     Labs independently reviewed: 05/2021 - TSH normal, BUN 15, serum creatinine 1.1, potassium 3.9, Hgb 13.5, PLT 235 05/2020 - albumin 4.6, AST/ALT normal 03/2020 - TC 186, TG 226, HDL 44, LDL 103    Past Medical History:  Diagnosis Date   Elevated LFTs 02/18/2018   Hypertension     Past Surgical History:  Procedure Laterality Date   TONSILLECTOMY Bilateral 1995    Current Medications: No outpatient medications have been marked as taking for the 07/18/21 encounter (Appointment) with Sondra Barges, PA-C.     Allergies:   Patient has no known allergies.   Social History   Socioeconomic History   Marital status: Single    Spouse name: Not on file   Number of children: Not on file   Years of education: Not on file   Highest education level: Not on file  Occupational History   Not on file  Tobacco Use   Smoking status: Never   Smokeless tobacco: Never  Vaping Use   Vaping Use: Never used  Substance and Sexual Activity   Alcohol use: Yes    Comment: Social   Drug use: Never   Sexual activity: Not on file  Other Topics Concern   Not on file  Social History Narrative   Not on file   Social Determinants of Health   Financial Resource Strain: Not on file  Food Insecurity: Not on file  Transportation Needs: Not on file  Physical Activity: Not on file  Stress: Not on file  Social Connections: Not on file     Family History:  The patient's family history includes Heart disease in his maternal grandmother; Hypertension in his mother.  ROS:   ROS   EKGs/Labs/Other Studies Reviewed:    Studies reviewed were summarized above. The additional studies were reviewed today: ***  EKG:  EKG is ordered today.  The EKG ordered today demonstrates ***  Recent Labs: 05/17/2021: BUN 15; Creatinine, Ser 1.10; Hemoglobin 13.5; Platelets 235; Potassium 3.9;  Sodium 137; TSH 2.880  Recent Lipid Panel    Component Value Date/Time   CHOL 186 03/29/2020 0911   TRIG 226 (H) 03/29/2020 0911   HDL 44 03/29/2020 0911   CHOLHDL 4.2 03/29/2020 0911   LDLCALC 103 (H) 03/29/2020 0911    PHYSICAL EXAM:    VS:  There were no vitals taken for this visit.  BMI: There is no height or weight on file to calculate BMI.  Physical Exam  Wt Readings from Last 3 Encounters:  05/17/21 (!) 338 lb (153.3 kg)  01/04/21 (!) 337 lb (152.9 kg)  06/25/20 (!) 330 lb (149.7 kg)     ASSESSMENT & PLAN:   HTN: Blood pressure ***  Morbid obesity/lower extremity swelling:  Sleep disordered  breathing:  Disposition: F/u with Dr. Azucena Cecil or an APP in ***.   Medication Adjustments/Labs and Tests Ordered: Current medicines are reviewed at length with the patient today.  Concerns regarding medicines are outlined above. Medication changes, Labs and Tests ordered today are summarized above and listed in the Patient Instructions accessible in Encounters.   Signed, Eula Listen, PA-C 07/16/2021 1:30 PM     [facility] [place] [address] [place], [place] [address] [phone]

## 2021-07-18 ENCOUNTER — Ambulatory Visit: Payer: BC Managed Care – PPO | Admitting: Physician Assistant

## 2021-07-19 ENCOUNTER — Encounter: Payer: Self-pay | Admitting: Physician Assistant

## 2021-07-26 ENCOUNTER — Other Ambulatory Visit: Payer: Self-pay

## 2021-07-26 DIAGNOSIS — I10 Essential (primary) hypertension: Secondary | ICD-10-CM

## 2021-07-26 MED ORDER — HYDROCHLOROTHIAZIDE 25 MG PO TABS: 25.0000 mg | ORAL_TABLET | Freq: Every day | ORAL | 0 refills | Status: DC

## 2021-07-30 ENCOUNTER — Other Ambulatory Visit: Payer: Self-pay

## 2021-07-30 DIAGNOSIS — I10 Essential (primary) hypertension: Secondary | ICD-10-CM

## 2021-07-30 MED ORDER — AMLODIPINE BESYLATE 10 MG PO TABS: 10.0000 mg | ORAL_TABLET | Freq: Every day | ORAL | 0 refills | Status: DC

## 2021-09-05 ENCOUNTER — Other Ambulatory Visit: Payer: Self-pay

## 2021-09-05 DIAGNOSIS — I10 Essential (primary) hypertension: Secondary | ICD-10-CM

## 2021-09-05 MED ORDER — AMLODIPINE BESYLATE 10 MG PO TABS: 10.0000 mg | ORAL_TABLET | Freq: Every day | ORAL | 0 refills | Status: DC

## 2021-09-05 MED ORDER — HYDROCHLOROTHIAZIDE 25 MG PO TABS: 25.0000 mg | ORAL_TABLET | Freq: Every day | ORAL | 0 refills | Status: DC

## 2021-09-20 ENCOUNTER — Other Ambulatory Visit: Payer: Self-pay

## 2021-09-20 ENCOUNTER — Telehealth: Payer: Self-pay | Admitting: Cardiology

## 2021-09-20 DIAGNOSIS — I10 Essential (primary) hypertension: Secondary | ICD-10-CM

## 2021-09-20 MED ORDER — AMLODIPINE BESYLATE 10 MG PO TABS: 10.0000 mg | ORAL_TABLET | Freq: Every day | ORAL | 0 refills | Status: DC

## 2021-09-20 MED ORDER — HYDROCHLOROTHIAZIDE 25 MG PO TABS: 25.0000 mg | ORAL_TABLET | Freq: Every day | ORAL | 0 refills | Status: DC

## 2021-09-20 NOTE — Telephone Encounter (Signed)
Attempted to schedule.  LMOV to call office.  ° °

## 2021-09-20 NOTE — Telephone Encounter (Signed)
-----   Message from Festus Aloe, CMA sent at 09/20/2021 11:52 AM EDT ----- Please schedule patient for a follow up with [facility]. Patient is requesting refills on medications.  Thanks, Jasmine December

## 2021-10-04 ENCOUNTER — Other Ambulatory Visit: Payer: Self-pay | Admitting: *Deleted

## 2021-10-04 DIAGNOSIS — I10 Essential (primary) hypertension: Secondary | ICD-10-CM

## 2021-10-04 MED ORDER — AMLODIPINE BESYLATE 10 MG PO TABS: 10.0000 mg | ORAL_TABLET | Freq: Every day | ORAL | 0 refills | Status: DC

## 2021-10-04 MED ORDER — HYDROCHLOROTHIAZIDE 25 MG PO TABS: 25.0000 mg | ORAL_TABLET | Freq: Every day | ORAL | 0 refills | Status: DC

## 2022-02-25 NOTE — Telephone Encounter (Signed)
Attempted to schedule lmov  ? ?No show for previously scheduled appt.  ?

## 2022-12-29 ENCOUNTER — Other Ambulatory Visit: Payer: Self-pay | Admitting: Cardiology

## 2022-12-29 DIAGNOSIS — I10 Essential (primary) hypertension: Secondary | ICD-10-CM

## 2022-12-30 NOTE — Telephone Encounter (Signed)
Please contact pt for future appointment. Pt overdue for 2 month f/u LS 05/2021. Pt needing refills.

## 2023-01-05 NOTE — Telephone Encounter (Signed)
LMOV  

## 2023-01-14 ENCOUNTER — Ambulatory Visit: Payer: Self-pay

## 2023-01-14 ENCOUNTER — Other Ambulatory Visit: Payer: Self-pay | Admitting: Family Medicine

## 2023-01-14 DIAGNOSIS — Z0000 Encounter for general adult medical examination without abnormal findings: Secondary | ICD-10-CM

## 2023-01-16 ENCOUNTER — Encounter: Payer: Self-pay | Admitting: Cardiology

## 2023-01-16 ENCOUNTER — Ambulatory Visit: Payer: BC Managed Care – PPO | Attending: Cardiology | Admitting: Cardiology

## 2023-01-16 VITALS — BP 164/86 | HR 103 | Ht 70.0 in | Wt 331.8 lb

## 2023-01-16 DIAGNOSIS — Z7189 Other specified counseling: Secondary | ICD-10-CM | POA: Diagnosis not present

## 2023-01-16 DIAGNOSIS — I10 Essential (primary) hypertension: Secondary | ICD-10-CM | POA: Diagnosis not present

## 2023-01-16 MED ORDER — HYDROCHLOROTHIAZIDE 25 MG PO TABS: 25.0000 mg | ORAL_TABLET | Freq: Every day | ORAL | 2 refills | Status: DC

## 2023-01-16 MED ORDER — AMLODIPINE BESYLATE 10 MG PO TABS: 10.0000 mg | ORAL_TABLET | Freq: Every day | ORAL | 2 refills | Status: DC

## 2023-01-16 MED ORDER — LOSARTAN POTASSIUM 50 MG PO TABS: 50.0000 mg | ORAL_TABLET | Freq: Every day | ORAL | 2 refills | Status: DC

## 2023-01-16 NOTE — Patient Instructions (Signed)
Medication Instructions:  Your physician recommends that you continue on your current medications as directed. Please refer to the Current Medication list given to you today.  *If you need a refill on your cardiac medications before your next appointment, please call your pharmacy*   Lab Work: -None ordered If you have labs (blood work) drawn today and your tests are completely normal, you will receive your results only by: [place] (if you have MyChart) OR A paper copy in the mail If you have any lab test that is abnormal or we need to change your treatment, we will call you to review the results.   Testing/Procedures: -None ordered   Follow-Up: At [facility], you and your health needs are our priority.  As part of our continuing mission to provide you with exceptional heart care, we have created designated Provider Care Teams.  These Care Teams include your primary Cardiologist (physician) and Advanced Practice Providers (APPs -  Physician Assistants and Nurse Practitioners) who all work together to provide you with the care you need, when you need it.  We recommend signing up for the patient portal called "MyChart".  Sign up information is provided on this After Visit Summary.  MyChart is used to connect with patients for Virtual Visits (Telemedicine).  Patients are able to view lab/test results, encounter notes, upcoming appointments, etc.  Non-urgent messages can be sent to your provider as well.   To learn more about what you can do with MyChart, go to [email or web address].    Your next appointment:   2 week(s)  Provider:   You may see Kate Sable, MD or one of the following Advanced Practice Providers on your designated Care Team:   Murray Hodgkins, NP Christell Faith, PA-C Cadence Kathlen Mody, PA-C Gerrie Nordmann, NP    Other Instructions -None

## 2023-01-16 NOTE — Progress Notes (Signed)
Cardiology Office Note:    Date:  01/16/2023   ID:  Randall Morrison, DOB 1986/07/30, MRN [id number]  PCP:  Patient, No Pcp Per  [place] Cardiologist:  Kate Sable, MD  [place] Electrophysiologist:  None   Referring MD: No ref. provider found   Chief Complaint  Patient presents with   Follow-up    Overdue 12 month f/u(05-2021), no new cardiac concerns, Medication update     History of Present Illness:    Randall Morrison is a 37 y.o. male with a hx of hypertension, obesity who presents for follow-up.    Have not seen patient for over a year, states running out of medications at least 2 months now.  Per his blood pressure check at his recent job, systolic was elevated around 160.  He was told he needed his BP to be less than 159 in order to have his job.  He feels well, denies chest pain or shortness of breath.    Past Medical History:  Diagnosis Date   Elevated LFTs 02/18/2018   Hypertension     Past Surgical History:  Procedure Laterality Date   TONSILLECTOMY Bilateral 1995    Current Medications: No outpatient medications have been marked as taking for the 01/16/23 encounter (Office Visit) with Kate Sable, MD.     Allergies:   Patient has no known allergies.   Social History   Socioeconomic History   Marital status: Single    Spouse name: Not on file   Number of children: Not on file   Years of education: Not on file   Highest education level: Not on file  Occupational History   Not on file  Tobacco Use   Smoking status: Never    Passive exposure: Past   Smokeless tobacco: Never  Vaping Use   Vaping Use: Never used  Substance and Sexual Activity   Alcohol use: Yes    Comment: Social   Drug use: Never   Sexual activity: Not on file  Other Topics Concern   Not on file  Social History Narrative   Not on file   Social Determinants of Health   Financial Resource Strain: Not on file  Food Insecurity: Not on file   Transportation Needs: Not on file  Physical Activity: Not on file  Stress: Not on file  Social Connections: Not on file     Family History: The patient's family history includes Heart disease in his maternal grandmother; Hypertension in his mother.  ROS:   Please see the history of present illness.     All other systems reviewed and are negative.  EKGs/Labs/Other Studies Reviewed:    The following studies were reviewed today:   EKG:  EKG is  ordered today.  The ekg ordered today demonstrates sinus tachycardia otherwise normal, heart rate 103  Recent Labs: No results found for requested labs within last 365 days.  Recent Lipid Panel    Component Value Date/Time   CHOL 186 03/29/2020 0911   TRIG 226 (H) 03/29/2020 0911   HDL 44 03/29/2020 0911   CHOLHDL 4.2 03/29/2020 0911   LDLCALC 103 (H) 03/29/2020 0911    Physical Exam:    VS:  BP (!) 164/86 (BP Location: Left Arm, Patient Position: Sitting, Cuff Size: Large)   Pulse (!) 103   Ht '5\' 10"'$  (1.778 m)   Wt (!) 331 lb 12.8 oz (150.5 kg)   SpO2 99%   BMI 47.61 kg/m     Wt Readings from  Last 3 Encounters:  01/16/23 (!) 331 lb 12.8 oz (150.5 kg)  05/17/21 (!) 338 lb (153.3 kg)  01/04/21 (!) 337 lb (152.9 kg)     GEN:  Well nourished, well developed in no acute distress HEENT: Normal NECK: No JVD; No carotid bruits LYMPHATICS: No lymphadenopathy CARDIAC: RRR, no murmurs, rubs, gallops RESPIRATORY:  Clear to auscultation without rales, wheezing or rhonchi  ABDOMEN: Soft, non-tender, non-distended MUSCULOSKELETAL:  No edema; No deformity  SKIN: Warm and dry NEUROLOGIC:  Alert and oriented x 3  PSYCHIATRIC:  Normal affect   ASSESSMENT:    1. Primary hypertension   2. Morbid obesity ([place])   3. Encounter for medication counseling    PLAN:    In order of problems listed above:  Hypertension, BP elevated.  Medication noncompliance.  Advised to take medicines as prescribed.  Restart/refill losartan 50 mg daily,  HCTZ 25 mg daily and amlodipine 10 mg daily.  Low-salt diet advised. Patient is morbidly obese, weight loss and low calorie diet advised. Patient strongly advised to be compliant with medications as prescribed.  Advised to establish care with a primary care provider.  Follow-up in 2 weeks for BP medication titration if needed.    Medication Adjustments/Labs and Tests Ordered: Current medicines are reviewed at length with the patient today.  Concerns regarding medicines are outlined above.  Orders Placed This Encounter  Procedures   EKG 12-Lead   Meds ordered this encounter  Medications   amLODipine (NORVASC) 10 MG tablet    Sig: Take 1 tablet (10 mg total) by mouth daily.    Dispense:  90 tablet    Refill:  2    PATIENT NEEDS AN APPOINTMENT TO CONTINUE GETTING REFILLS   hydrochlorothiazide (HYDRODIURIL) 25 MG tablet    Sig: Take 1 tablet (25 mg total) by mouth daily.    Dispense:  90 tablet    Refill:  2    PATIENT NEEDS AN APPOINTMENT FOR ADDITIONAL REFILLS.   losartan (COZAAR) 50 MG tablet    Sig: Take 1 tablet (50 mg total) by mouth daily.    Dispense:  90 tablet    Refill:  2    Patient Instructions  Medication Instructions:  Your physician recommends that you continue on your current medications as directed. Please refer to the Current Medication list given to you today.  *If you need a refill on your cardiac medications before your next appointment, please call your pharmacy*   Lab Work: -None ordered If you have labs (blood work) drawn today and your tests are completely normal, you will receive your results only by: [place] (if you have MyChart) OR A paper copy in the mail If you have any lab test that is abnormal or we need to change your treatment, we will call you to review the results.   Testing/Procedures: -None ordered   Follow-Up: At [facility], you and your health needs are our priority.  As part of our continuing mission to  provide you with exceptional heart care, we have created designated Provider Care Teams.  These Care Teams include your primary Cardiologist (physician) and Advanced Practice Providers (APPs -  Physician Assistants and Nurse Practitioners) who all work together to provide you with the care you need, when you need it.  We recommend signing up for the patient portal called "MyChart".  Sign up information is provided on this After Visit Summary.  MyChart is used to connect with patients for Virtual Visits (Telemedicine).  Patients are  able to view lab/test results, encounter notes, upcoming appointments, etc.  Non-urgent messages can be sent to your provider as well.   To learn more about what you can do with MyChart, go to [email or web address].    Your next appointment:   2 week(s)  Provider:   You may see Kate Sable, MD or one of the following Advanced Practice Providers on your designated Care Team:   Murray Hodgkins, NP Christell Faith, PA-C Cadence Kathlen Mody, PA-C Gerrie Nordmann, NP    Other Instructions -None    Signed, Kate Sable, MD  01/16/2023 5:13 PM    [place]

## 2023-01-19 ENCOUNTER — Telehealth: Payer: Self-pay | Admitting: General Practice

## 2023-01-19 ENCOUNTER — Ambulatory Visit: Payer: BC Managed Care – PPO | Admitting: Nurse Practitioner

## 2023-01-19 NOTE — Telephone Encounter (Signed)
3.4.24 no show letter sent

## 2023-01-27 NOTE — Telephone Encounter (Signed)
Noted  

## 2023-01-27 NOTE — Telephone Encounter (Signed)
1st no show, fee waived, letter sent to reschedule/notify of policy 

## 2023-02-03 ENCOUNTER — Ambulatory Visit: Payer: BC Managed Care – PPO | Admitting: Cardiology

## 2023-02-16 ENCOUNTER — Ambulatory Visit: Payer: BC Managed Care – PPO | Attending: Cardiology | Admitting: Cardiology

## 2023-02-16 ENCOUNTER — Encounter: Payer: Self-pay | Admitting: Cardiology

## 2023-03-23 ENCOUNTER — Telehealth: Payer: Self-pay | Admitting: Physician Assistant

## 2023-03-24 ENCOUNTER — Encounter: Payer: Self-pay | Admitting: Physician Assistant

## 2023-03-24 ENCOUNTER — Ambulatory Visit: Payer: BC Managed Care – PPO | Admitting: Physician Assistant

## 2023-03-24 VITALS — BP 162/83 | HR 120 | Ht 70.0 in | Wt 334.6 lb

## 2023-03-24 DIAGNOSIS — Z7689 Persons encountering health services in other specified circumstances: Secondary | ICD-10-CM

## 2023-03-24 DIAGNOSIS — R7989 Other specified abnormal findings of blood chemistry: Secondary | ICD-10-CM | POA: Diagnosis not present

## 2023-03-24 DIAGNOSIS — I10 Essential (primary) hypertension: Secondary | ICD-10-CM | POA: Diagnosis not present

## 2023-03-24 DIAGNOSIS — J302 Other seasonal allergic rhinitis: Secondary | ICD-10-CM

## 2023-03-24 DIAGNOSIS — Z8249 Family history of ischemic heart disease and other diseases of the circulatory system: Secondary | ICD-10-CM

## 2023-03-24 MED ORDER — LOSARTAN POTASSIUM-HCTZ 50-12.5 MG PO TABS: 1.0000 | ORAL_TABLET | Freq: Every day | ORAL | 1 refills | Status: DC

## 2023-03-24 MED ORDER — AMLODIPINE BESYLATE 10 MG PO TABS: 10.0000 mg | ORAL_TABLET | Freq: Every day | ORAL | 2 refills | Status: DC

## 2023-03-24 NOTE — Progress Notes (Signed)
I,Randall Morrison,acting as a Neurosurgeon for [employer], PA-C.,have documented all relevant documentation on the behalf of Debera Lat, PA-C,as directed by  [employer], PA-C while in the presence of [employer], PA-C.   New patient visit   Patient: Randall Morrison   DOB: [birth year]   37 y.o. Male  MRN: [id number] Visit Date: 03/24/2023  Today's healthcare provider: Debera Lat, PA-C   No chief complaint on file.  Subjective    Randall Morrison is a 37 y.o. male who presents today as a new patient to establish care.  HPI  -HIV Screening: declined -Hepatitis C Screening: yes Well Adult Physical: Patient here for a comprehensive physical exam.The patient reports no problems Do you take any herbs or supplements that were not prescribed by a doctor? no Are you taking calcium supplements? no Are you taking aspirin daily? no GU History: Patient receives prostate care outside our clinic Date last prostate exam: {date:30663} Date last PSA: {date:30663}   Past Medical History:  Diagnosis Date   Elevated LFTs 02/18/2018   Hypertension    Past Surgical History:  Procedure Laterality Date   TONSILLECTOMY Bilateral 1995   Family Status  Relation Name Status   Mother  Alive   MGM  Alive   Father  Alive   Family History  Problem Relation Age of Onset   Hypertension Mother    Heart disease Maternal Grandmother    Social History   Socioeconomic History   Marital status: Single    Spouse name: Not on file   Number of children: Not on file   Years of education: Not on file   Highest education level: Not on file  Occupational History   Not on file  Tobacco Use   Smoking status: Never    Passive exposure: Past   Smokeless tobacco: Never  Vaping Use   Vaping Use: Never used  Substance and Sexual Activity   Alcohol use: Yes    Comment: Social   Drug use: Never   Sexual activity: Not on file  Other Topics Concern   Not on file  Social History Narrative    Not on file   Social Determinants of Health   Financial Resource Strain: Not on file  Food Insecurity: Not on file  Transportation Needs: Not on file  Physical Activity: Not on file  Stress: Not on file  Social Connections: Not on file   Outpatient Medications Prior to Visit  Medication Sig   amLODipine (NORVASC) 10 MG tablet Take 1 tablet (10 mg total) by mouth daily.   hydrochlorothiazide (HYDRODIURIL) 25 MG tablet Take 1 tablet (25 mg total) by mouth daily.   losartan (COZAAR) 50 MG tablet Take 1 tablet (50 mg total) by mouth daily.   No facility-administered medications prior to visit.   No Known Allergies  Immunization History  Administered Date(s) Administered   DTaP 08/22/1988, 05/14/1989, 07/06/1991   IPV 05/14/1988, 08/22/1988, 07/06/1991   MMR 08/22/1988   Moderna Sars-Covid-2 Vaccination 06/15/2020, 07/13/2020, 12/27/2020   Polio, Unspecified 08/22/1988, 05/14/1989, 07/06/1991   Tdap 12/08/2016    Health Maintenance  Topic Date Due   HIV Screening  Never done   Hepatitis C Screening  Never done   COVID-19 Vaccine (4 - 2023-24 season) 07/18/2022   INFLUENZA VACCINE  06/18/2023   DTaP/Tdap/Td (5 - Td or Tdap) 12/08/2026   HPV VACCINES  Aged Out    Patient Care Team: Patient, No Pcp Per as PCP - General (General Practice) Debbe Odea, MD  as PCP - Cardiology (Cardiology)  Review of Systems  Allergic/Immunologic: Positive for environmental allergies.    {Labs  Heme  Chem  Endocrine  Serology  Results Review (optional):23779}   Objective    There were no vitals taken for this visit. {Show previous vital signs (optional):23777}  Physical Exam ***  Depression Screen     No data to display         No results found for any visits on 03/24/23.  Assessment & Plan     ***  No follow-ups on file.     {provider attestation***:1}   Debera Lat, PA-C  [facility] [phone] (phone) [phone]  (fax)  [facility]

## 2023-03-25 DIAGNOSIS — Z8249 Family history of ischemic heart disease and other diseases of the circulatory system: Secondary | ICD-10-CM | POA: Insufficient documentation

## 2023-03-25 DIAGNOSIS — Z7689 Persons encountering health services in other specified circumstances: Secondary | ICD-10-CM | POA: Insufficient documentation

## 2023-03-25 DIAGNOSIS — J302 Other seasonal allergic rhinitis: Secondary | ICD-10-CM | POA: Insufficient documentation

## 2023-03-25 NOTE — Assessment & Plan Note (Signed)
Associated with hypertension Chronic and BMI 48 today Pt needs to proceed with strict low-carb, low-fat diet and daily exercise./Outside play with his 2 kids Encourage weight loss of 5% of his current weight visit in 3 to 6 months via diet and exercise Will reassess after receiving lab results

## 2023-03-25 NOTE — Assessment & Plan Note (Signed)
-   Avoidance measures discussed. - Use nasal saline rinses before nose sprays such as with Neilmed Sinus Rinse bottle.  Use distilled water.   - Use Flonase 2 sprays each nostril daily. Aim upward and outward. - Use Zyrtec 10 mg daily versus over-the-counter Allegra or Claritin

## 2023-03-25 NOTE — Assessment & Plan Note (Signed)
Chronic, unstable Questioning pt compliance Blood pressure today was 162/83 Advised to schedule follow-up appointment with cardiology Previously treated with losartan 50  mg, HCTZ 25 Mg, amlodipine 10 Mg Advised to stop losartan 50 Mg-HCTZ 12.5 Mg twice daily, amlodipine 10 Mg.  Refills was sent to patient's pharmacy Patient was advised to measure his blood pressure for the next 2 weeks, if blood pressure stays high advised to schedule an appointment.  Otherwise advised to schedule follow-up appointment in 6 weeks Low-salt diet strongly advised Check CBC and CMP, lipid panel, TSH, A1c Goal of <140/<90 given no previously history of DM; however, A1c was checked at this time Patient advised to fast before doing his labs

## 2023-03-25 NOTE — Assessment & Plan Note (Signed)
In the past, advised weight loss via healthy diet and daily exercise Will recheck CMP for liver enzymes

## 2023-03-25 NOTE — Assessment & Plan Note (Signed)
  Welcomed to our clinic Reviewed past medical hx, social hx, family hx and surgical hx Pt advised to sign a release form for his  future and old records Including vaccination records

## 2023-05-05 ENCOUNTER — Ambulatory Visit: Payer: BC Managed Care – PPO | Admitting: Physician Assistant

## 2023-05-05 NOTE — Progress Notes (Deleted)
  Established patient visit  Patient: Randall Morrison   DOB: Dec 27, 1985   37 y.o. Male  MRN: [id number] Visit Date: 05/05/2023  Today's healthcare provider: Debera Lat, PA-C   No chief complaint on file.  Subjective    HPI  Hypertension, follow-up  BP Readings from Last 3 Encounters:  03/24/23 (!) 162/83  01/16/23 (!) 164/86  05/17/21 (!) 180/90   Wt Readings from Last 3 Encounters:  03/24/23 (!) 334 lb 9.6 oz (151.8 kg)  01/16/23 (!) 331 lb 12.8 oz (150.5 kg)  05/17/21 (!) 338 lb (153.3 kg)     He was last seen for hypertension {NUMBERS 1-12:18279} {days/wks/mos/yrs:310907} ago.  BP at that visit was ***. Management since that visit includes ***.  He reports {excellent/good/fair/poor:19665} compliance with treatment. He {is/is not:9024} having side effects. {document side effects if present:1} He is following a {diet:21022986} diet. He {is/is not:9024} exercising. He {does/does not:200015} smoke.  Use of agents associated with hypertension: {bp agents assoc with hypertension:511::"none"}.   Outside blood pressures are {***enter patient reported home BP readings, or 'not being checked':1}. Symptoms: {Yes/No:20286} chest pain {Yes/No:20286} chest pressure  {Yes/No:20286} palpitations {Yes/No:20286} syncope  {Yes/No:20286} dyspnea {Yes/No:20286} orthopnea  {Yes/No:20286} paroxysmal nocturnal dyspnea {Yes/No:20286} lower extremity edema   Pertinent labs Lab Results  Component Value Date   CHOL 186 03/29/2020   HDL 44 03/29/2020   LDLCALC 103 (H) 03/29/2020   TRIG 226 (H) 03/29/2020   CHOLHDL 4.2 03/29/2020   Lab Results  Component Value Date   NA 137 05/17/2021   K 3.9 05/17/2021   CREATININE 1.10 05/17/2021   EGFR 90 05/17/2021   GLUCOSE 116 (H) 05/17/2021   TSH 2.880 05/17/2021     The ASCVD Risk score (Arnett DK, et al., 2019) failed to calculate for the following reasons:   The 2019 ASCVD risk score is only valid for ages 19 to  76  ---------------------------------------------------------------------------------------------------      03/24/2023    3:34 PM  Depression screen PHQ 2/9  Decreased Interest 0  Down, Depressed, Hopeless 0  PHQ - 2 Score 0  Altered sleeping 0  Tired, decreased energy 0  Change in appetite 0  Feeling bad or failure about yourself  0  Trouble concentrating 0  Moving slowly or fidgety/restless 0  Suicidal thoughts 0  PHQ-9 Score 0  Difficult doing work/chores Not difficult at all       No data to display          Medications: Outpatient Medications Prior to Visit  Medication Sig   amLODipine (NORVASC) 10 MG tablet Take 1 tablet (10 mg total) by mouth daily.   losartan-hydrochlorothiazide (HYZAAR) 50-12.5 MG tablet Take 1 tablet by mouth daily.   No facility-administered medications prior to visit.    Review of Systems Except see HPI   {Labs  Heme  Chem  Endocrine  Serology  Results Review (optional):23779}   Objective    There were no vitals taken for this visit. {Show previous vital signs (optional):23777}  Physical Exam   No results found for any visits on 05/05/23.  Assessment & Plan    ***  No follow-ups on file.      [facility]

## 2023-07-12 ENCOUNTER — Other Ambulatory Visit: Payer: Self-pay | Admitting: Physician Assistant

## 2023-07-12 DIAGNOSIS — I10 Essential (primary) hypertension: Secondary | ICD-10-CM

## 2023-07-12 DIAGNOSIS — Z7689 Persons encountering health services in other specified circumstances: Secondary | ICD-10-CM

## 2023-07-12 DIAGNOSIS — Z8249 Family history of ischemic heart disease and other diseases of the circulatory system: Secondary | ICD-10-CM

## 2023-07-14 NOTE — Telephone Encounter (Signed)
Requested medications are due for refill today.  no  Requested medications are on the active medications list.  yes  Last refill. 03/24/2023 #90 1 rf  Future visit scheduled.   no  Notes to clinic.  Refill not due - labs are expired.    Requested Prescriptions  Pending Prescriptions Disp Refills   losartan-hydrochlorothiazide (HYZAAR) 50-12.5 MG tablet [Pharmacy Med Name: LOSARTAN-HCTZ 50-12.5 MG TAB] 90 tablet 1    Sig: TAKE 1 TABLET BY MOUTH EVERY DAY     Cardiovascular: ARB + Diuretic Combos Failed - 07/12/2023  9:07 AM      Failed - K in normal range and within 180 days    Potassium  Date Value Ref Range Status  05/17/2021 3.9 3.5 - 5.2 mmol/L Final         Failed - Na in normal range and within 180 days    Sodium  Date Value Ref Range Status  05/17/2021 137 134 - 144 mmol/L Final         Failed - Cr in normal range and within 180 days    Creatinine, Ser  Date Value Ref Range Status  05/17/2021 1.10 0.76 - 1.27 mg/dL Final         Failed - eGFR is 10 or above and within 180 days    GFR calc Af Amer  Date Value Ref Range Status  05/29/2020 >60 >60 mL/min Final   GFR calc non Af Amer  Date Value Ref Range Status  05/29/2020 >60 >60 mL/min Final   eGFR  Date Value Ref Range Status  05/17/2021 90 >59 mL/min/1.73 Final         Failed - Last BP in normal range    BP Readings from Last 1 Encounters:  03/24/23 (!) 162/83         Passed - Patient is not pregnant      Passed - Valid encounter within last 6 months    Recent Outpatient Visits           3 months ago Primary hypertension   [facility] [place], [place], [place]

## 2023-08-11 ENCOUNTER — Other Ambulatory Visit: Payer: Self-pay | Admitting: Physician Assistant

## 2023-08-11 DIAGNOSIS — Z8249 Family history of ischemic heart disease and other diseases of the circulatory system: Secondary | ICD-10-CM

## 2023-08-11 DIAGNOSIS — I10 Essential (primary) hypertension: Secondary | ICD-10-CM

## 2023-08-11 DIAGNOSIS — Z7689 Persons encountering health services in other specified circumstances: Secondary | ICD-10-CM

## 2023-08-12 NOTE — Telephone Encounter (Signed)
Patient called, left VM to return the call to the office to scheduled an appt for medication refill request.

## 2023-08-12 NOTE — Telephone Encounter (Signed)
Requested medication (s) are due for refill today: yes  Requested medication (s) are on the active medication list: yes  Last refill:  07/15/23 #30/0  Future visit scheduled: no  Notes to clinic:  pt is due for OV and updated labs, called pt, LVMTCB to schedule      Requested Prescriptions  Pending Prescriptions Disp Refills   losartan-hydrochlorothiazide (HYZAAR) 50-12.5 MG tablet [Pharmacy Med Name: LOSARTAN-HCTZ 50-12.5 MG TAB] 90 tablet 1    Sig: TAKE 1 TABLET BY MOUTH EVERY DAY     Cardiovascular: ARB + Diuretic Combos Failed - 08/11/2023  2:32 PM      Failed - K in normal range and within 180 days    Potassium  Date Value Ref Range Status  05/17/2021 3.9 3.5 - 5.2 mmol/L Final         Failed - Na in normal range and within 180 days    Sodium  Date Value Ref Range Status  05/17/2021 137 134 - 144 mmol/L Final         Failed - Cr in normal range and within 180 days    Creatinine, Ser  Date Value Ref Range Status  05/17/2021 1.10 0.76 - 1.27 mg/dL Final         Failed - eGFR is 10 or above and within 180 days    GFR calc Af Amer  Date Value Ref Range Status  05/29/2020 >60 >60 mL/min Final   GFR calc non Af Amer  Date Value Ref Range Status  05/29/2020 >60 >60 mL/min Final   eGFR  Date Value Ref Range Status  05/17/2021 90 >59 mL/min/1.73 Final         Failed - Last BP in normal range    BP Readings from Last 1 Encounters:  03/24/23 (!) 162/83         Passed - Patient is not pregnant      Passed - Valid encounter within last 6 months    Recent Outpatient Visits           4 months ago Primary hypertension   [facility] [place], [place], [place]

## 2023-08-16 NOTE — Progress Notes (Unsigned)
Patient was not seen for appt d/t no call, no show, or late arrival >10 mins past appt time.    Debera Lat PA [facility] [address] [place], [place] [address] [phone] (phone) [phone] (fax) [facility]

## 2023-08-21 ENCOUNTER — Ambulatory Visit (INDEPENDENT_AMBULATORY_CARE_PROVIDER_SITE_OTHER): Payer: BC Managed Care – PPO | Admitting: Physician Assistant

## 2023-08-21 DIAGNOSIS — Z91199 Patient's noncompliance with other medical treatment and regimen due to unspecified reason: Secondary | ICD-10-CM

## 2023-08-28 ENCOUNTER — Ambulatory Visit: Payer: BC Managed Care – PPO | Admitting: Physician Assistant

## 2023-08-28 VITALS — BP 173/87 | HR 119 | Temp 98.3°F | Resp 20 | Ht 70.0 in | Wt 345.7 lb

## 2023-08-28 DIAGNOSIS — Z8249 Family history of ischemic heart disease and other diseases of the circulatory system: Secondary | ICD-10-CM

## 2023-08-28 DIAGNOSIS — I10 Essential (primary) hypertension: Secondary | ICD-10-CM | POA: Diagnosis not present

## 2023-08-28 DIAGNOSIS — Z7689 Persons encountering health services in other specified circumstances: Secondary | ICD-10-CM

## 2023-08-28 MED ORDER — LOSARTAN POTASSIUM-HCTZ 50-12.5 MG PO TABS
1.0000 | ORAL_TABLET | Freq: Every day | ORAL | 1 refills | Status: DC
Start: 2023-08-28 — End: 2023-09-03

## 2023-08-28 NOTE — Progress Notes (Signed)
Established patient visit  Patient: Randall Morrison   DOB: 1986-05-21   37 y.o. Male  MRN: [id number] Visit Date: 08/28/2023  Today's healthcare provider: Debera Lat, PA-C   Chief Complaint  Patient presents with   Follow-up for BP    Pulled hamstring    Subjective     Discussed the use of AI scribe software for clinical note transcription with the patient, who gave verbal consent to proceed.  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 The patient reports a slight limp but is otherwise managing the injury with rest and an elastic bandage.  The patient's primary concern is his hypertension. He has been monitoring his blood pressure at home, with readings consistently in the low 140s/high 130s over 80s. The patient admits to feeling nervous during doctor visits, which he believes may contribute to elevated readings. He is currently on Losartan-Hydrochlorothiazide and Amlodipine for blood pressure management.  The patient also discusses his weight and expresses interest in a weight loss program. He reports making efforts towards a healthier diet and regular gym visits. He notes a low intake of red meat in his diet, primarily consuming white meat, fish, and wheat.           08/28/2023    3:29 PM 03/24/2023    3:34 PM  Depression screen PHQ 2/9  Decreased Interest 0 0  Down, Depressed, Hopeless 0 0  PHQ - 2 Score 0 0  Altered sleeping  0  Tired, decreased energy  0  Change in appetite  0  Feeling bad or failure about yourself   0  Trouble concentrating  0  Moving slowly or fidgety/restless  0  Suicidal thoughts  0  PHQ-9 Score  0  Difficult doing work/chores  Not difficult at all       No data to display          Medications: Outpatient Medications Prior to Visit  Medication Sig   amLODipine (NORVASC) 10 MG tablet Take 1 tablet (10 mg total) by mouth daily.    [DISCONTINUED] losartan-hydrochlorothiazide (HYZAAR) 50-12.5 MG tablet TAKE 1 TABLET BY MOUTH EVERY DAY   No facility-administered medications prior to visit.    Review of Systems  All other systems reviewed and are negative.  Except see HPI       Objective    BP (!) 173/87 (BP Location: Left Arm, Patient Position: Sitting, Cuff Size: Large)   Pulse (!) 119   Temp 98.3 F (36.8 C)   Resp 20   Ht 5\' 10"  (1.778 m)   Wt (!) 345 lb 11.2 oz (156.8 kg)   SpO2 100%   BMI 49.60 kg/m     Physical Exam Vitals reviewed.  Constitutional:      General: He is not in acute distress.    Appearance: Normal appearance. He is not diaphoretic.  HENT:     Head: Normocephalic and atraumatic.  Eyes:     General: No scleral icterus.    Conjunctiva/sclera: Conjunctivae normal.  Cardiovascular:     Rate and Rhythm: Normal rate and regular rhythm.     Pulses: Normal pulses.     Heart sounds: Normal heart sounds. No murmur heard. Pulmonary:     Effort: Pulmonary effort is normal. No respiratory distress.     Breath sounds: Normal breath sounds. No wheezing or rhonchi.  Musculoskeletal:     Cervical back: Neck supple.  Right lower leg: No edema.     Left lower leg: No edema.  Lymphadenopathy:     Cervical: No cervical adenopathy.  Skin:    General: Skin is warm and dry.     Findings: No rash.  Neurological:     Mental Status: He is alert and oriented to person, place, and time. Mental status is at baseline.  Psychiatric:        Mood and Affect: Mood normal.        Behavior: Behavior normal.      No results found for any visits on 08/28/23.  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 No swelling, only pain. Currently managing with rest and elastic bandage. -Continue rest and use of elastic bandage. -Consider use of Tylenol for pain management.  Hypertension Chronic and unstable Family hx positive for cardiovascular disease Home  blood pressure readings in the 130s-140s/80s. Currently on Losartan-Hydrochlorothiazide and Amlodipine. Discussed the risks of prolonged hypertension including changes to the vessels in the eyes and kidneys, and increased risk of stroke. -Increase Losartan-Hydrochlorothiazide to twice daily. -Check blood pressure at home and document readings in patient chart. -Return in 4 weeks for follow-up. Pt has not been consistent with taking his BP meds on a regular basis. Strongly encouraged to take his medications daily as well as measure his BP at home If BP will be more than 140/90, he needs to be seen next week or proceed to ER Advised strict low salt diet and daily exercise  Medication Refill Patient down to last pill of Losartan-Hydrochlorothiazide and Amlodipine. -Send prescription refill for Losartan-Hydrochlorothiazide and Amlodipine.  Obesity Weight Management Patient open to trying weight loss medication. No history of thyroid problems, abdominal pain, or family history of thyroid cancer. Patient reports attempts at healthier diet and gym attendance 2-3 times per week. -Refer to nutritionist or weight loss program. -Check blood work results for potential use of weight loss medication. -Encourage continuation of healthier diet and regular exercise.     Return in about 4 weeks (around 09/25/2023) for chronic disease f/u, BP f/u.     The patient was advised to call back or seek an in-person evaluation if the symptoms worsen or if the condition fails to improve as anticipated.  I discussed the assessment and treatment plan with the patient. The patient was provided an opportunity to ask questions and all were answered. The patient agreed with the plan and demonstrated an understanding of the instructions.  I, Debera Lat, PA-C have reviewed all documentation for this visit. The documentation on  08/28/23 for the exam, diagnosis, procedures, and orders are all accurate and complete.  Debera Lat, [facility], MMS [facility] [phone] (phone) [phone] (fax)  [facility]

## 2023-08-29 ENCOUNTER — Encounter: Payer: Self-pay | Admitting: Physician Assistant

## 2023-08-29 LAB — CBC WITH DIFFERENTIAL/PLATELET
Basophils Absolute: 0 10*3/uL (ref 0.0–0.2)
Basos: 0 %
EOS (ABSOLUTE): 0.1 10*3/uL (ref 0.0–0.4)
Eos: 2 %
Hematocrit: 47.4 % (ref 37.5–51.0)
Hemoglobin: 14.7 g/dL (ref 13.0–17.7)
Immature Grans (Abs): 0 10*3/uL (ref 0.0–0.1)
Immature Granulocytes: 0 %
Lymphocytes Absolute: 3 10*3/uL (ref 0.7–3.1)
Lymphs: 44 %
MCH: 26.9 pg (ref 26.6–33.0)
MCHC: 31 g/dL — ABNORMAL LOW (ref 31.5–35.7)
MCV: 87 fL (ref 79–97)
Monocytes Absolute: 0.6 10*3/uL (ref 0.1–0.9)
Monocytes: 8 %
Neutrophils Absolute: 3.2 10*3/uL (ref 1.4–7.0)
Neutrophils: 46 %
Platelets: 247 10*3/uL (ref 150–450)
RBC: 5.47 x10E6/uL (ref 4.14–5.80)
RDW: 12.4 % (ref 11.6–15.4)
WBC: 6.9 10*3/uL (ref 3.4–10.8)

## 2023-08-29 LAB — COMPREHENSIVE METABOLIC PANEL
ALT: 29 [IU]/L (ref 0–44)
AST: 27 [IU]/L (ref 0–40)
Albumin: 4.6 g/dL (ref 4.1–5.1)
Alkaline Phosphatase: 71 [IU]/L (ref 44–121)
BUN/Creatinine Ratio: 12 (ref 9–20)
BUN: 16 mg/dL (ref 6–20)
Bilirubin Total: 0.4 mg/dL (ref 0.0–1.2)
CO2: 24 mmol/L (ref 20–29)
Calcium: 10.3 mg/dL — ABNORMAL HIGH (ref 8.7–10.2)
Chloride: 102 mmol/L (ref 96–106)
Creatinine, Ser: 1.29 mg/dL — ABNORMAL HIGH (ref 0.76–1.27)
Globulin, Total: 3 g/dL (ref 1.5–4.5)
Glucose: 95 mg/dL (ref 70–99)
Potassium: 5 mmol/L (ref 3.5–5.2)
Sodium: 140 mmol/L (ref 134–144)
Total Protein: 7.6 g/dL (ref 6.0–8.5)
eGFR: 74 mL/min/{1.73_m2} (ref >59)

## 2023-08-29 LAB — LIPID PANEL
Chol/HDL Ratio: 3.4 {ratio} (ref 0.0–5.0)
Cholesterol, Total: 177 mg/dL (ref 100–199)
HDL: 52 mg/dL (ref >39)
LDL Chol Calc (NIH): 109 mg/dL — ABNORMAL HIGH (ref 0–99)
Triglycerides: 89 mg/dL (ref 0–149)
VLDL Cholesterol Cal: 16 mg/dL (ref 5–40)

## 2023-08-29 LAB — TSH: TSH: 3.42 u[IU]/mL (ref 0.450–4.500)

## 2023-08-29 LAB — HEMOGLOBIN A1C
Est. average glucose Bld gHb Est-mCnc: 137 mg/dL
Hgb A1c MFr Bld: 6.4 % — ABNORMAL HIGH (ref 4.8–5.6)

## 2023-08-31 ENCOUNTER — Other Ambulatory Visit: Payer: Self-pay | Admitting: Physician Assistant

## 2023-08-31 DIAGNOSIS — R7303 Prediabetes: Secondary | ICD-10-CM

## 2023-08-31 MED ORDER — METFORMIN HCL 500 MG PO TABS
500.0000 mg | ORAL_TABLET | Freq: Every day | ORAL | 0 refills | Status: DC
Start: 2023-08-31 — End: 2023-11-26

## 2023-09-03 ENCOUNTER — Telehealth: Payer: Self-pay | Admitting: Physician Assistant

## 2023-09-03 ENCOUNTER — Other Ambulatory Visit: Payer: Self-pay | Admitting: Physician Assistant

## 2023-09-03 DIAGNOSIS — Z7689 Persons encountering health services in other specified circumstances: Secondary | ICD-10-CM

## 2023-09-03 DIAGNOSIS — Z8249 Family history of ischemic heart disease and other diseases of the circulatory system: Secondary | ICD-10-CM

## 2023-09-03 DIAGNOSIS — I10 Essential (primary) hypertension: Secondary | ICD-10-CM

## 2023-09-03 MED ORDER — LOSARTAN POTASSIUM-HCTZ 50-12.5 MG PO TABS: ORAL_TABLET | ORAL | 1 refills | Status: DC

## 2023-09-03 NOTE — Telephone Encounter (Signed)
Chase with [facility] Randall Morrison needs clarification on the pt's Losartan  CB@  [phone]

## 2023-09-10 ENCOUNTER — Encounter (INDEPENDENT_AMBULATORY_CARE_PROVIDER_SITE_OTHER): Payer: Self-pay | Admitting: Adult Health

## 2023-09-20 NOTE — Progress Notes (Unsigned)
  Established patient visit Patient was not seen for appt d/t no call, no show, or late arrival >10 mins past appt time.    Debera Lat PA [facility] [address] [place], [place] [address] [phone] (phone) [phone] (fax) [facility]

## 2023-09-25 ENCOUNTER — Ambulatory Visit (INDEPENDENT_AMBULATORY_CARE_PROVIDER_SITE_OTHER): Payer: BC Managed Care – PPO | Admitting: Physician Assistant

## 2023-09-25 DIAGNOSIS — Z91199 Patient's noncompliance with other medical treatment and regimen due to unspecified reason: Secondary | ICD-10-CM

## 2023-11-26 ENCOUNTER — Other Ambulatory Visit: Payer: Self-pay | Admitting: Physician Assistant

## 2023-11-26 DIAGNOSIS — R7303 Prediabetes: Secondary | ICD-10-CM

## 2023-12-29 ENCOUNTER — Encounter: Payer: Self-pay | Admitting: Physician Assistant

## 2023-12-29 ENCOUNTER — Other Ambulatory Visit: Payer: Self-pay | Admitting: Physician Assistant

## 2023-12-29 DIAGNOSIS — R7303 Prediabetes: Secondary | ICD-10-CM

## 2024-02-03 ENCOUNTER — Other Ambulatory Visit: Payer: Self-pay | Admitting: Physician Assistant

## 2024-02-03 DIAGNOSIS — I10 Essential (primary) hypertension: Secondary | ICD-10-CM

## 2024-02-03 DIAGNOSIS — Z8249 Family history of ischemic heart disease and other diseases of the circulatory system: Secondary | ICD-10-CM

## 2024-02-03 DIAGNOSIS — Z7689 Persons encountering health services in other specified circumstances: Secondary | ICD-10-CM

## 2024-02-04 NOTE — Telephone Encounter (Signed)
 Requested Prescriptions  Pending Prescriptions Disp Refills   amLODipine (NORVASC) 10 MG tablet [Pharmacy Med Name: AMLODIPINE BESYLATE 10 MG TAB] 30 tablet 0    Sig: TAKE 1 TABLET BY MOUTH EVERY DAY     Cardiovascular: Calcium Channel Blockers 2 Failed - 02/04/2024 10:01 AM      Failed - Last BP in normal range    BP Readings from Last 1 Encounters:  08/28/23 (!) 173/87         Failed - Last Heart Rate in normal range    Pulse Readings from Last 1 Encounters:  08/28/23 (!) 119         Passed - Valid encounter within last 6 months    Recent Outpatient Visits           4 months ago No-show for appointment   [facility] [place], [place], PA-C   5 months ago Primary hypertension   [place] [facility] [place], [place], PA-C   5 months ago No-show for appointment   [facility] [place], [place], [place]   10 months ago Primary hypertension   [place] [place], [place], [place]

## 2024-03-10 ENCOUNTER — Other Ambulatory Visit: Payer: Self-pay | Admitting: Physician Assistant

## 2024-03-10 DIAGNOSIS — Z8249 Family history of ischemic heart disease and other diseases of the circulatory system: Secondary | ICD-10-CM

## 2024-03-10 DIAGNOSIS — I10 Essential (primary) hypertension: Secondary | ICD-10-CM

## 2024-03-10 DIAGNOSIS — Z7689 Persons encountering health services in other specified circumstances: Secondary | ICD-10-CM

## 2024-03-11 NOTE — Telephone Encounter (Signed)
 Requested medication (s) are due for refill today:   Yes  Requested medication (s) are on the active medication list:   Yes  Future visit scheduled:   No.   Left a voicemail to call and schedule 6 month check with Randall Ostwalt, PA-C.   Last ordered: 02/04/2024 #30, 0 refills  Unable to refill because 6 month OV needed per protocol.      Requested Prescriptions  Pending Prescriptions Disp Refills   amLODipine  (NORVASC ) 10 MG tablet [Pharmacy Med Name: AMLODIPINE  BESYLATE 10 MG TAB] 90 tablet 1    Sig: TAKE 1 TABLET BY MOUTH EVERY DAY     Cardiovascular: Calcium Channel Blockers 2 Failed - 03/11/2024 11:07 AM      Failed - Last BP in normal range    BP Readings from Last 1 Encounters:  08/28/23 (!) 173/87         Failed - Last Heart Rate in normal range    Pulse Readings from Last 1 Encounters:  08/28/23 (!) 119         Failed - Valid encounter within last 6 months    Recent Outpatient Visits   None

## 2024-06-30 ENCOUNTER — Telehealth: Admitting: Emergency Medicine

## 2024-06-30 DIAGNOSIS — J069 Acute upper respiratory infection, unspecified: Secondary | ICD-10-CM

## 2024-06-30 DIAGNOSIS — H00011 Hordeolum externum right upper eyelid: Secondary | ICD-10-CM

## 2024-06-30 NOTE — Progress Notes (Signed)
 Virtual Visit Consent   [name], you are scheduled for a virtual visit with a [facility] provider today. Just as with appointments in the office, your consent must be obtained to participate. Your consent will be active for this visit and any virtual visit you may have with one of our providers in the next 365 days. If you have a MyChart account, a copy of this consent can be sent to you electronically.  As this is a virtual visit, video technology does not allow for your provider to perform a traditional examination. This may limit your provider's ability to fully assess your condition. If your provider identifies any concerns that need to be evaluated in person or the need to arrange testing (such as labs, EKG, etc.), we will make arrangements to do so. Although advances in technology are sophisticated, we cannot ensure that it will always work on either your end or our end. If the connection with a video visit is poor, the visit may have to be switched to a telephone visit. With either a video or telephone visit, we are not always able to ensure that we have a secure connection.  By engaging in this virtual visit, you consent to the provision of healthcare and authorize for your insurance to be billed (if applicable) for the services provided during this visit. Depending on your insurance coverage, you may receive a charge related to this service.  I need to obtain your verbal consent now. Are you willing to proceed with your visit today? Randall Morrison has provided verbal consent on 06/30/2024 for a virtual visit (video or telephone). Jon CHRISTELLA Belt, NP  Date: 06/30/2024 8:30 AM   Virtual Visit via Video Note   I, Jon CHRISTELLA Belt, connected with  Randall Morrison  ([id number], 1986-04-11) on 06/30/24 at  8:30 AM EDT by a video-enabled telemedicine application and verified that I am speaking with the correct person using two identifiers.  Location: Patient: Virtual Visit  Location Patient: Home Provider: Virtual Visit Location Provider: Home Office   I discussed the limitations of evaluation and management by telemedicine and the availability of in person appointments. The patient expressed understanding and agreed to proceed.    History of Present Illness: Randall Morrison is a 38 y.o. who identifies as a male who was assigned male at birth, and is being seen today for R upper eyelid swelling since yesterday. Has never had a stye before. Also has a cold with nasal congestion and drainage.   R eye is red and did have some drainage from it this morning when he woke up. No vision change  No treatments at home.   HPI: HPI  Problems:  Patient Active Problem List   Diagnosis Date Noted   Encounter to establish care 03/25/2023   Family history of cardiovascular disease 03/25/2023   Seasonal allergies 03/25/2023   Elevated LFTs    Hypertension    Morbid obesity (HCC) 06/25/2020    Allergies: No Known Allergies Medications:  Current Outpatient Medications:    amLODipine  (NORVASC ) 10 MG tablet, TAKE 1 TABLET BY MOUTH EVERY DAY, Disp: 90 tablet, Rfl: 1   losartan -hydrochlorothiazide  (HYZAAR) 50-12.5 MG tablet, Take 1 tablet twice daily by mouth, Disp: 90 tablet, Rfl: 1   metFORMIN  (GLUCOPHAGE ) 500 MG tablet, TAKE 1 TABLET BY MOUTH EVERY DAY WITH BREAKFAST, Disp: 90 tablet, Rfl: 0  Observations/Objective: Patient is well-developed, well-nourished in no acute distress.  Resting comfortably  at home.  Head is normocephalic,  atraumatic.  No labored breathing.  Speech is clear and coherent with logical content.  Patient is alert and oriented at baseline.  R upper eyelid with visible lump that appears to be external with associated mild swelling. Conjunctiva is mildly injected.  Does sound like he has nasal congsetion on video  Assessment and Plan: 1. Hordeolum externum of right upper eyelid (Primary)  2. Upper respiratory tract infection, unspecified  type  I suspect stye but could also have a separate viral pink eye from his cold.   Follow Up Instructions: I discussed the assessment and treatment plan with the patient. The patient was provided an opportunity to ask questions and all were answered. The patient agreed with the plan and demonstrated an understanding of the instructions.  A copy of instructions were sent to the patient via MyChart unless otherwise noted below.   The patient was advised to call back or seek an in-person evaluation if the symptoms worsen or if the condition fails to improve as anticipated.    Jon CHRISTELLA Belt, NP

## 2024-06-30 NOTE — Patient Instructions (Signed)
  Randall Morrison, thank you for joining Jon CHRISTELLA Belt, NP for today's virtual visit.  While this provider is not your primary care provider (PCP), if your PCP is located in our provider database this encounter information will be shared with them immediately following your visit.   A [facility] MyChart account gives you access to today's visit and all your visits, tests, and labs performed at [facility]  click here if you don't have a [facility] MyChart account or go to [email or web address]  Consent: (Patient) Randall Morrison provided verbal consent for this virtual visit at the beginning of the encounter.  Current Medications:  Current Outpatient Medications:    amLODipine (NORVASC) 10 MG tablet, TAKE 1 TABLET BY MOUTH EVERY DAY, Disp: 90 tablet, Rfl: 1   losartan-hydrochlorothiazide (HYZAAR) 50-12.5 MG tablet, Take 1 tablet twice daily by mouth, Disp: 90 tablet, Rfl: 1   metFORMIN (GLUCOPHAGE) 500 MG tablet, TAKE 1 TABLET BY MOUTH EVERY DAY WITH BREAKFAST, Disp: 90 tablet, Rfl: 0   Medications ordered in this encounter:  No orders of the defined types were placed in this encounter.    *If you need refills on other medications prior to your next appointment, please contact your pharmacy*  Follow-Up: Call back or seek an in-person evaluation if the symptoms worsen or if the condition fails to improve as anticipated.  [facility] [phone]  Other Instructions Use a warm, wet wash cloth to your right eye several times a day to help the stye resolve. For the pink color in your eye and drainage, try using an over the counter allergy/antihistamine eye drop like Pataday or the generic olopatadine per package instructions.   See attached hand out on styes   If you have been instructed to have an in-person evaluation today at a local Urgent Care facility, please use the link below. It will take you to a list of all of our available [facility], including address, phone number and hours of operation. Please do not delay care.  [facility]  If you or a family member do not have a primary care provider, use the link below to schedule a visit and establish care. When you choose a [facility] primary care physician or advanced practice provider, you gain a long-term partner in health. Find a Primary Care Provider  Learn more about 's in-office and virtual care options:  - Get Care Now

## 2024-09-16 ENCOUNTER — Other Ambulatory Visit: Payer: Self-pay | Admitting: Physician Assistant

## 2024-09-16 DIAGNOSIS — I10 Essential (primary) hypertension: Secondary | ICD-10-CM

## 2024-09-16 DIAGNOSIS — Z8249 Family history of ischemic heart disease and other diseases of the circulatory system: Secondary | ICD-10-CM

## 2024-09-16 DIAGNOSIS — Z7689 Persons encountering health services in other specified circumstances: Secondary | ICD-10-CM

## 2024-09-20 ENCOUNTER — Ambulatory Visit: Admitting: Physician Assistant

## 2024-09-20 ENCOUNTER — Encounter: Payer: Self-pay | Admitting: Physician Assistant

## 2024-09-20 VITALS — BP 151/77 | HR 112 | Resp 16 | Ht 70.0 in | Wt 352.4 lb

## 2024-09-20 DIAGNOSIS — R7989 Other specified abnormal findings of blood chemistry: Secondary | ICD-10-CM

## 2024-09-20 DIAGNOSIS — R7303 Prediabetes: Secondary | ICD-10-CM

## 2024-09-20 DIAGNOSIS — J3089 Other allergic rhinitis: Secondary | ICD-10-CM

## 2024-09-20 DIAGNOSIS — Z3009 Encounter for other general counseling and advice on contraception: Secondary | ICD-10-CM

## 2024-09-20 DIAGNOSIS — Z8249 Family history of ischemic heart disease and other diseases of the circulatory system: Secondary | ICD-10-CM | POA: Diagnosis not present

## 2024-09-20 DIAGNOSIS — I10 Essential (primary) hypertension: Secondary | ICD-10-CM | POA: Diagnosis not present

## 2024-09-20 MED ORDER — METFORMIN HCL 500 MG PO TABS: 500.0000 mg | ORAL_TABLET | Freq: Every day | ORAL | 3 refills | Status: AC

## 2024-09-20 MED ORDER — LOSARTAN POTASSIUM-HCTZ 50-12.5 MG PO TABS: 1.0000 | ORAL_TABLET | Freq: Every day | ORAL | 3 refills | Status: AC

## 2024-09-20 MED ORDER — AMLODIPINE BESYLATE 10 MG PO TABS: 10.0000 mg | ORAL_TABLET | Freq: Every day | ORAL | 3 refills | Status: AC

## 2024-09-20 NOTE — Progress Notes (Signed)
 Established patient visit  Patient: Randall Morrison   DOB: 12-28-1985   37 y.o. Male  MRN: [id number] Visit Date: 09/20/2024  Today's healthcare provider: Jolynn Spencer, PA-C   Chief Complaint  Patient presents with      Hypertension    Pt has not been taking his losartan -hydrochlorothiazide  medication since it has not been filled   Diabetes    Pt has not been taking his diabetes medication since it has not been filled   Subjective     HPI     Hypertension    Additional comments: Pt has not been taking his losartan -hydrochlorothiazide  medication since it has not been filled        Diabetes    Additional comments: Pt has not been taking his diabetes medication since it has not been filled      Last edited by Wilfred Hargis RAMAN, CMA on 09/20/2024  3:48 PM.       Discussed the use of AI scribe software for clinical note transcription with the patient, who gave verbal consent to proceed.  History of Present Illness Randall Morrison is a 38 year old male with hypertension and prediabetes who presents for management of elevated blood pressure.  He experiences elevated blood pressure, currently at 151 mmHg, improved from 173 mmHg on August 28, 2023. He takes amlodipine  but has not refilled other antihypertensive medications due to personal circumstances, including the birth of his new baby six months ago. He takes metformin  for prediabetes. He is physically active at work, walking about half a mile to three-quarters of a mile on active days. His weight fluctuates with seasonal activity changes. He has three children, including a six-month-old baby, and his mother assists with childcare while his wife works. No chest pain, shortness of breath, leg swelling, weakness, numbness, tingling, or bowel movement issues.       09/20/2024    3:43 PM 08/28/2023    3:29 PM 03/24/2023    3:34 PM  Depression screen PHQ 2/9  Decreased Interest 0 0 0  Down, Depressed, Hopeless 0  0 0  PHQ - 2 Score 0 0 0  Altered sleeping   0  Tired, decreased energy   0  Change in appetite   0  Feeling bad or failure about yourself    0  Trouble concentrating   0  Moving slowly or fidgety/restless   0  Suicidal thoughts   0  PHQ-9 Score   0  Difficult doing work/chores   Not difficult at all       No data to display          Medications: Outpatient Medications Prior to Visit  Medication Sig   losartan -hydrochlorothiazide  (HYZAAR) 50-12.5 MG tablet Take 1 tablet by mouth in the morning and at bedtime.   metFORMIN  (GLUCOPHAGE ) 500 MG tablet Take 1 tablet by mouth daily.   amLODipine  (NORVASC ) 10 MG tablet TAKE 1 TABLET BY MOUTH EVERY DAY   [DISCONTINUED] losartan -hydrochlorothiazide  (HYZAAR) 50-12.5 MG tablet Take 1 tablet twice daily by mouth   [DISCONTINUED] metFORMIN  (GLUCOPHAGE ) 500 MG tablet TAKE 1 TABLET BY MOUTH EVERY DAY WITH BREAKFAST   No facility-administered medications prior to visit.    Review of Systems All negative Except see HPI       Objective    BP (!) 151/77   Pulse (!) 112   Resp 16   Ht 5' 10 (1.778 m)   Wt (!) 352 lb 6.4 oz (159.8 kg)   SpO2  100%   BMI 50.56 kg/m     Physical Exam Vitals reviewed.  Constitutional:      General: He is not in acute distress.    Appearance: Normal appearance. He is not ill-appearing, toxic-appearing or diaphoretic.  HENT:     Head: Normocephalic and atraumatic.     Right Ear: Tympanic membrane, ear canal and external ear normal.     Left Ear: Tympanic membrane, ear canal and external ear normal.     Nose: Congestion and rhinorrhea present.     Mouth/Throat:     Pharynx: Posterior oropharyngeal erythema present.  Eyes:     General: No scleral icterus.       Right eye: No discharge.        Left eye: No discharge.     Extraocular Movements: Extraocular movements intact.     Conjunctiva/sclera: Conjunctivae normal.     Pupils: Pupils are equal, round, and reactive to light.  Cardiovascular:      Rate and Rhythm: Normal rate and regular rhythm.     Pulses: Normal pulses.     Heart sounds: Normal heart sounds. No murmur heard. Pulmonary:     Effort: Pulmonary effort is normal. No respiratory distress.     Breath sounds: Normal breath sounds. No wheezing or rhonchi.  Abdominal:     General: Abdomen is flat. Bowel sounds are normal.     Palpations: Abdomen is soft.  Musculoskeletal:        General: Normal range of motion.     Cervical back: Normal range of motion and neck supple.     Right lower leg: No edema.     Left lower leg: No edema.  Lymphadenopathy:     Cervical: No cervical adenopathy.  Skin:    General: Skin is warm and dry.     Findings: No rash.  Neurological:     General: No focal deficit present.     Mental Status: He is alert and oriented to person, place, and time. Mental status is at baseline.  Psychiatric:        Mood and Affect: Mood normal.        Behavior: Behavior normal.        Thought Content: Thought content normal.      No results found for any visits on 09/20/24.      Assessment & Plan Essential hypertension Chronic and unstable  blood pressure at 151 mmHg, improved from 173 mmHg in October 2024, likely due to partial medication adherence. Non-adherence risks vascular damage and stroke. - Prescribed antihypertensive medication for one year with 90-day refills. - Instructed to measure blood pressure at home twice daily for six weeks and record readings. - Advised to maintain blood pressure below 130/80 mmHg. - Discussed lifestyle modifications: reduce sodium and spice intake, increase water consumption, and engage in regular exercise. - Scheduled follow-up appointment in six weeks.  Prediabetes Chronic and most likely unstable  continued metformin  management is crucial to prevent diabetes and cardiovascular risks. - Refilled metformin  prescription. - Ordered blood work to monitor cholesterol and other relevant parameters. Advise  low-carb diet and regular exercise Will follow-up  Morbid obesity Chronic  Current BMI 52.56  Associated with prediabetes, hypertension/uncontrolled  weight loss can positively impact blood pressure, cholesterol, and overall health. - Encouraged regular physical activity and healthy eating habits. - Advised on meal planning and preparation to support weight management. Will follow-up  Planned vasectomy He desires a vasectomy and requires a recommendation for a urologist. -  Provided recommendation for urologist consultation for vasectomy. Referral for urology was placed  Morbid obesity (HCC)  - amLODipine  (NORVASC ) 10 MG tablet; Take 1 tablet (10 mg total) by mouth daily.  Dispense: 90 tablet; Refill: 3 - CBC with Differential/Platelet - Comprehensive metabolic panel with GFR - Hemoglobin A1c - Lipid panel - TSH  Family history of cardiovascular disease  - amLODipine  (NORVASC ) 10 MG tablet; Take 1 tablet (10 mg total) by mouth daily.  Dispense: 90 tablet; Refill: 3 - CBC with Differential/Platelet - Comprehensive metabolic panel with GFR - Hemoglobin A1c - Lipid panel - TSH  Primary hypertension - losartan -hydrochlorothiazide  (HYZAAR) 50-12.5 MG tablet; Take 1 tablet by mouth daily.  Dispense: 90 tablet; Refill: 3 - metFORMIN  (GLUCOPHAGE ) 500 MG tablet; Take 1 tablet (500 mg total) by mouth daily.  Dispense: 90 tablet; Refill: 3 - amLODipine  (NORVASC ) 10 MG tablet; Take 1 tablet (10 mg total) by mouth daily.  Dispense: 90 tablet; Refill: 3 - CBC with Differential/Platelet - Comprehensive metabolic panel with GFR - Hemoglobin A1c - Lipid panel - TSH  Prediabetes - metFORMIN  (GLUCOPHAGE ) 500 MG tablet; Take 1 tablet (500 mg total) by mouth daily.  Dispense: 90 tablet; Refill: 3 - amLODipine  (NORVASC ) 10 MG tablet; Take 1 tablet (10 mg total) by mouth daily.  Dispense: 90 tablet; Refill: 3 - CBC with Differential/Platelet - Comprehensive metabolic panel with GFR -  Hemoglobin A1c - Lipid panel - TSH  Elevated LFTs In the past, will repeat cmp - amLODipine  (NORVASC ) 10 MG tablet; Take 1 tablet (10 mg total) by mouth daily.  Dispense: 90 tablet; Refill: 3 - CBC with Differential/Platelet - Comprehensive metabolic panel with GFR - Hemoglobin A1c - Lipid panel - TSH  Allergic Rhinitis: - Avoidance measures discussed. - Use nasal saline rinses before nose sprays such as with Neilmed Sinus Rinse bottle.  Use distilled water.   - Use Flonase 2 sprays each nostril daily. Aim upward and outward. - Use Zyrtec 10 mg daily.   Vasectomy evaluation  - Ambulatory referral to Urology  No orders of the defined types were placed in this encounter.   No follow-ups on file.   The patient was advised to call back or seek an in-person evaluation if the symptoms worsen or if the condition fails to improve as anticipated.  I discussed the assessment and treatment plan with the patient. The patient was provided an opportunity to ask questions and all were answered. The patient agreed with the plan and demonstrated an understanding of the instructions.  I, Malicia Blasdel, PA-C have reviewed all documentation for this visit. The documentation on 09/20/2024  for the exam, diagnosis, procedures, and orders are all accurate and complete.  Jolynn Spencer, [facility], MMS [facility] [phone] (phone) [phone] (fax)  [facility]

## 2024-10-11 ENCOUNTER — Ambulatory Visit: Admitting: Urology

## 2024-10-12 ENCOUNTER — Ambulatory Visit: Admitting: Urology

## 2024-10-12 VITALS — BP 179/81 | HR 111 | Ht 70.0 in | Wt 347.0 lb

## 2024-10-12 DIAGNOSIS — Z3009 Encounter for other general counseling and advice on contraception: Secondary | ICD-10-CM

## 2024-10-12 MED ORDER — DIAZEPAM 10 MG PO TABS: 10.0000 mg | ORAL_TABLET | Freq: Once | ORAL | 0 refills | Status: DC | PRN

## 2024-10-12 NOTE — Progress Notes (Signed)
   10/12/24 2:01 PM   Randall Morrison June 30, 1986 [id number]  CC: Discuss vasectomy  HPI: 38 year old male with morbid obesity BMI 50 interested in vasectomy for permanent sterilization.  He has 3 children, does not desire any further biologic pregnancies.   PMH: Past Medical History:  Diagnosis Date   Allergy    Diabetes mellitus without complication (HCC)    Elevated LFTs 02/18/2018   Hypertension       Family History: Family History  Problem Relation Age of Onset   Hypertension Mother    Heart disease Maternal Grandmother     Social History:  reports that he has never smoked. He has been exposed to tobacco smoke. He has never used smokeless tobacco. He reports current alcohol use of about 2.0 standard drinks of alcohol per week. He reports that he does not use drugs.  Physical Exam: BP (!) 179/81 (BP Location: Left Arm, Patient Position: Sitting, Cuff Size: Large)   Pulse (!) 111   Ht 5' 10 (1.778 m)   Wt (!) 347 lb (157.4 kg)   SpO2 98%   BMI 49.79 kg/m    Constitutional:  Alert and oriented, No acute distress. Cardiovascular: No clubbing, cyanosis, or edema. Respiratory: Normal respiratory effort, no increased work of breathing. GI: Abdomen is soft, nontender, nondistended, no abdominal masses GU: Testicles 20 cc in send bilaterally without masses, vas deferens palpable bilaterally  Assessment & Plan:   38 year old male interested in vasectomy for permanent sterilization  We discussed the risks and benefits of vasectomy at length.  Vasectomy is intended to be a permanent form of contraception, and does not produce immediate sterility.  Following vasectomy another form of contraception is required until vas occlusion is confirmed by a post-vasectomy semen analysis obtained 2-3 months after the procedure.  Even after vas occlusion is confirmed, vasectomy is not 100% reliable in preventing pregnancy, and the failure rate is approximately 11/1998.  Repeat  vasectomy is required in less than 1% of patients.  He should refrain from ejaculation for 1 week after vasectomy.  Options for fertility after vasectomy include vasectomy reversal, and sperm retrieval with in vitro fertilization or ICSI.  These options are not always successful and may be expensive.  Finally, there are other permanent and non-permanent alternatives to vasectomy available. There is no risk of erectile dysfunction, and the volume of semen will be similar to prior, as the majority of the ejaculate is from the prostate and seminal vesicles.   The procedure takes ~20 minutes.  We recommend patients take 5-10 mg of Valium  30 minutes prior, and he will need a driver post-procedure.  Local anesthetic is injected into the scrotal skin and a small segment of the vas deferens is removed, and the ends occluded. The complication rate is approximately 1-2%, and includes bleeding, infection, and development of chronic scrotal pain.  PLAN: Schedule vasectomy Valium  sent to pharmacy   Redell Burnet, MD 10/12/2024  [facility] [address], [address] [place], [place] [address] [phone]

## 2024-10-12 NOTE — Patient Instructions (Signed)

## 2024-10-28 ENCOUNTER — Encounter: Payer: Self-pay | Admitting: Physician Assistant

## 2024-10-28 ENCOUNTER — Ambulatory Visit: Admitting: Physician Assistant

## 2024-10-28 VITALS — BP 158/85 | HR 121 | Temp 98.5°F | Ht 70.0 in | Wt 349.5 lb

## 2024-10-28 DIAGNOSIS — I10 Essential (primary) hypertension: Secondary | ICD-10-CM

## 2024-10-28 DIAGNOSIS — R7989 Other specified abnormal findings of blood chemistry: Secondary | ICD-10-CM | POA: Diagnosis not present

## 2024-10-28 DIAGNOSIS — Z0001 Encounter for general adult medical examination with abnormal findings: Secondary | ICD-10-CM | POA: Diagnosis not present

## 2024-10-28 DIAGNOSIS — Z0000 Encounter for general adult medical examination without abnormal findings: Secondary | ICD-10-CM

## 2024-10-28 DIAGNOSIS — R7303 Prediabetes: Secondary | ICD-10-CM

## 2024-10-28 NOTE — Progress Notes (Signed)
 Complete physical exam  Patient: Randall Morrison   DOB: May 12, 1986   38 y.o. Male  MRN: [id number] Visit Date: 10/28/2024  Today's healthcare provider: Jolynn Spencer, PA-C   Chief Complaint  Patient presents with   Annual Exam    Diet- Trying to but not the best Exercise- Little bit, tries to walk a mile and a half a day Overall feeling- Fine Sleep- Decents Concerns- None  Declined vaccines   Subjective    Randall Morrison is a 38 y.o. male who presents today for a complete physical exam.   HPI     Annual Exam    Additional comments: Diet- Trying to but not the best Exercise- Little bit, tries to walk a mile and a half a day Overall feeling- Fine Sleep- Decents Concerns- None  Declined vaccines      Last edited by Terrel Powell LITTIE, CMA on 10/28/2024  3:36 PM.      Discussed the use of AI scribe software for clinical note transcription with the patient, who gave verbal consent to proceed.  History of Present Illness Randall Morrison is a 38 year old male with hypertension who presents for a physical exam.  He has been checking blood pressure at home with readings around 137/77 mmHg despite losartan  50 mg with hydrochlorothiazide  12.5 mg daily and amlodipine  10 mg daily. His blood pressure initially improved to about 127/75 mmHg but then increased. He feels rested in the morning when he gets enough sleep and has not had a sleep study.  He has no ear pain, hearing changes, or nasal congestion. He has mild post-nasal drainage, which started after his daughter was sick. He uses nasal saline rinses and increased fluids.  He has no anxiety, depression, bowel issues, or foot fungal problems. He drinks alcohol infrequently, about 1-2 drinks twice a month.  He walks at least 1.5 miles daily at work and is working on improving his diet.    Last depression screening scores    10/28/2024    3:43 PM 09/20/2024    3:43 PM 08/28/2023    3:29 PM  PHQ  2/9 Scores  PHQ - 2 Score 0 0 0  PHQ- 9 Score 0     Last fall risk screening    10/28/2024    3:43 PM  Fall Risk   Falls in the past year? 0  Number falls in past yr: 0  Injury with Fall? 0  Risk for fall due to : No Fall Risks   Last Audit-C alcohol use screening    09/20/2024    3:44 PM  Alcohol Use Disorder Test (AUDIT)  1. How often do you have a drink containing alcohol? 0  2. How many drinks containing alcohol do you have on a typical day when you are drinking? 0  3. How often do you have six or more drinks on one occasion? 0  AUDIT-C Score 0   A score of 3 or more in women, and 4 or more in men indicates increased risk for alcohol abuse, EXCEPT if all of the points are from question 1   Past Medical History:  Diagnosis Date   Allergy    Diabetes mellitus without complication (HCC)    Elevated LFTs 02/18/2018   Hypertension    Past Surgical History:  Procedure Laterality Date   TONSILLECTOMY Bilateral 1995   Social History   Socioeconomic History   Marital status: Married    Spouse name: Not on  file   Number of children: 2   Years of education: Not on file   Highest education level: Not on file  Occupational History   Not on file  Tobacco Use   Smoking status: Never    Passive exposure: Past   Smokeless tobacco: Never  Vaping Use   Vaping status: Never Used  Substance and Sexual Activity   Alcohol use: Yes    Alcohol/week: 2.0 standard drinks of alcohol    Types: 1 Cans of beer, 1 Shots of liquor per week    Comment: Social   Drug use: Never   Sexual activity: Yes    Birth control/protection: None  Other Topics Concern   Not on file  Social History Narrative   Not on file   Social Drivers of Health   Tobacco Use: Low Risk (10/28/2024)   Patient History    Smoking Tobacco Use: Never    Smokeless Tobacco Use: Never    Passive Exposure: Past  Financial Resource Strain: Low Risk (09/20/2024)   Overall Financial Resource Strain (CARDIA)     Difficulty of Paying Living Expenses: Not very hard  Food Insecurity: No Food Insecurity (09/20/2024)   Epic    Worried About Programme Researcher, Broadcasting/film/video in the Last Year: Never true    Ran Out of Food in the Last Year: Never true  Transportation Needs: No Transportation Needs (09/20/2024)   Epic    Lack of Transportation (Medical): No    Lack of Transportation (Non-Medical): No  Physical Activity: Sufficiently Active (09/20/2024)   Exercise Vital Sign    Days of Exercise per Week: 5 days    Minutes of Exercise per Session: 80 min  Stress: No Stress Concern Present (09/20/2024)   [employer] of Occupational Health - Occupational Stress Questionnaire    Feeling of Stress: Not at all  Social Connections: Socially Integrated (09/20/2024)   Social Connection and Isolation Panel    Frequency of Communication with Friends and Family: More than three times a week    Frequency of Social Gatherings with Friends and Family: Twice a week    Attends Religious Services: More than 4 times per year    Active Member of Clubs or Organizations: No    Attends Engineer, Structural: More than 4 times per year    Marital Status: Married  Catering Manager Violence: Not At Risk (09/20/2024)   Epic    Fear of Current or Ex-Partner: No    Emotionally Abused: No    Physically Abused: No    Sexually Abused: No  Depression (PHQ2-9): Low Risk (10/28/2024)   Depression (PHQ2-9)    PHQ-2 Score: 0  Alcohol Screen: Low Risk (09/20/2024)   Alcohol Screen    Last Alcohol Screening Score (AUDIT): 0  Housing: Low Risk (09/20/2024)   Epic    Unable to Pay for Housing in the Last Year: No    Number of Times Moved in the Last Year: 0    Homeless in the Last Year: No  Utilities: Not At Risk (09/20/2024)   Epic    Threatened with loss of utilities: No  Health Literacy: Adequate Health Literacy (09/20/2024)   B1300 Health Literacy    Frequency of need for help with medical instructions: Never   Family Status   Relation Name Status   Mother  Alive   MGM  Alive   Father  Alive  No partnership data on file   Family History  Problem Relation Age of Onset  Hypertension Mother    Heart disease Maternal Grandmother    Allergies[1]  Patient Care Team: Gwendelyn Lanting, PA-C as PCP - General (Physician Assistant) Darliss Rogue, MD as PCP - Cardiology (Cardiology)   Medications: Show/hide medication list[2]  Review of Systems  All other systems reviewed and are negative.  Except see HPI     Objective    BP (!) 158/85 (BP Location: Right Arm, Patient Position: Sitting, Cuff Size: Large)   Pulse (!) 121   Temp 98.5 F (36.9 C) (Oral)   Ht 5' 10 (1.778 m)   Wt (!) 349 lb 8 oz (158.5 kg)   SpO2 98%   BMI 50.15 kg/m      Physical Exam Vitals reviewed.  Constitutional:      General: He is not in acute distress.    Appearance: Normal appearance. He is well-developed. He is not ill-appearing, toxic-appearing or diaphoretic.  HENT:     Head: Normocephalic and atraumatic.     Right Ear: Tympanic membrane, ear canal and external ear normal.     Left Ear: Tympanic membrane, ear canal and external ear normal.     Nose: Nose normal. No congestion or rhinorrhea.     Mouth/Throat:     Mouth: Mucous membranes are moist.     Pharynx: Oropharynx is clear. No oropharyngeal exudate.  Eyes:     General: No scleral icterus.       Right eye: No discharge.        Left eye: No discharge.     Conjunctiva/sclera: Conjunctivae normal.     Pupils: Pupils are equal, round, and reactive to light.  Neck:     Thyroid: No thyromegaly.     Vascular: No carotid bruit.  Cardiovascular:     Rate and Rhythm: Normal rate and regular rhythm.     Pulses: Normal pulses.     Heart sounds: Normal heart sounds. No murmur heard.    No friction rub. No gallop.  Pulmonary:     Effort: Pulmonary effort is normal. No respiratory distress.     Breath sounds: Normal breath sounds. No wheezing or rales.   Abdominal:     General: Abdomen is flat. Bowel sounds are normal. There is no distension.     Palpations: Abdomen is soft. There is no mass.     Tenderness: There is no abdominal tenderness. There is no right CVA tenderness, left CVA tenderness, guarding or rebound.     Hernia: No hernia is present.  Musculoskeletal:        General: No swelling, tenderness, deformity or signs of injury. Normal range of motion.     Cervical back: Normal range of motion and neck supple. No rigidity or tenderness.     Right lower leg: No edema.     Left lower leg: No edema.  Lymphadenopathy:     Cervical: No cervical adenopathy.  Skin:    General: Skin is warm and dry.     Coloration: Skin is not jaundiced or pale.     Findings: No bruising, erythema, lesion or rash.  Neurological:     Mental Status: He is alert and oriented to person, place, and time. Mental status is at baseline.     Gait: Gait normal.  Psychiatric:        Mood and Affect: Mood normal.        Behavior: Behavior normal.        Thought Content: Thought content normal.  Judgment: Judgment normal.      No results found for any visits on 10/28/24.  Assessment & Plan    Routine Health Maintenance and Physical Exam  Exercise Activities and Dietary recommendations  Goals   None     Immunization History  Administered Date(s) Administered   DTaP 08/22/1988, 05/14/1989, 07/06/1991   IPV 05/14/1988, 08/22/1988, 07/06/1991   MMR 08/22/1988   Moderna Sars-Covid-2 Vaccination 06/15/2020, 07/13/2020, 12/27/2020   Polio, Unspecified 08/22/1988, 05/14/1989, 07/06/1991   Tdap 12/08/2016    Health Maintenance  Topic Date Due   HIV Screening  Never done   Hepatitis C Screening  Never done   Hepatitis B Vaccine (1 of 3 - 19+ 3-dose series) Never done   HPV Vaccine (1 - 3-dose SCDM series) Never done   Flu Shot  Never done   COVID-19 Vaccine (4 - 2025-26 season) 07/18/2024   DTaP/Tdap/Td vaccine (5 - Td or Tdap) 12/08/2026    Pneumococcal Vaccine  Aged Out   Meningitis B Vaccine  Aged Out    Discussed health benefits of physical activity, and encouraged him to engage in regular exercise appropriate for his age and condition.  Assessment & Plan Primary hypertension Chronic and unstable  blood pressure not well controlled with current regimen. Home readings show variability. - Increased losartan -hydrochlorothiazide  to two tablets daily. - Monitor blood pressure daily, morning and evening. - Scheduled follow-up appointment in 6-7 weeks to reassess blood pressure control. Continue low-sodium diet and regular exercise Will follow-up  Morbid obesity Chronic and stable  Associated with hypertension, prediabetes, elevated discussed benefits of weight loss on blood pressure, cholesterol, and overall health. Consideration of weight loss medication contingent on insurance coverage. - Check with insurance regarding coverage for weight loss medication. - If insurance covers weight loss medication, will place referral to weight loss program. - Encouraged continued exercise and dietary modifications. Lab work from 09/20/2024 pending Will follow-up  Upper respiratory symptoms (viral) Symptoms likely due to viral infection. - Use nasal saline rinse. - Increase fluid intake, including hot tea. Take over-the-counter Tylenol or ibuprofen for pain and fever management Take plenty of water Return to clinic if symptoms persist or worsen  General health maintenance Routine health maintenance discussed. - Schedule annual eye and dental check-ups. - Continue regular exercise and healthy diet.   Prediabetes Chronic previously unstable Continue metformin  Continue low-carb diet and regular exercise Check American diabetic Association website for more information on low-carb diet Follow-up  Elevated LFTs In the past Lab work/pending   Annual physical exam (Primary) UTD on dental/eye Things to do to keep yourself  healthy  - Exercise at least 30-45 minutes a day, 3-4 days a week.  - Eat a low-fat diet with lots of fruits and vegetables, up to 7-9 servings per day.  - Seatbelts can save your life. Wear them always.  - Smoke detectors on every level of your home, check batteries every year.  - Eye Doctor - have an eye exam every 1-2 years  - Safe sex - if you may be exposed to STDs, use a condom.  - Alcohol -  If you drink, do it moderately, less than 2 drinks per day.  - Health Care Power of Attorney. Choose someone to speak for you if you are not able.  - Depression is common in our stressful world.If you're feeling down or losing interest in things you normally enjoy, please come in for a visit.  - Violence - If anyone is threatening or hurting you, please  call immediately.    Return for CPE in a year and chronic disease / bp fu in 6 weeks/labs.    The patient was advised to call back or seek an in-person evaluation if the symptoms worsen or if the condition fails to improve as anticipated.  I discussed the assessment and treatment plan with the patient. The patient was provided an opportunity to ask questions and all were answered. The patient agreed with the plan and demonstrated an understanding of the instructions.  I, Juni Glaab, PA-C have reviewed all documentation for this visit. The documentation on 10/28/2024  for the exam, diagnosis, procedures, and orders are all accurate and complete.  Jolynn Spencer, [facility], MMS [facility] [phone] (phone) [phone] (fax)  [facility]    [1] No Known Allergies [2]  Outpatient Medications Prior to Visit  Medication Sig Note   amLODipine  (NORVASC ) 10 MG tablet Take 1 tablet (10 mg total) by mouth daily.    losartan -hydrochlorothiazide  (HYZAAR) 50-12.5 MG tablet Take 1 tablet by mouth daily.    metFORMIN  (GLUCOPHAGE ) 500 MG tablet Take 1 tablet (500 mg total) by mouth daily.    [DISCONTINUED] diazepam  (VALIUM )  10 MG tablet Take 1 tablet (10 mg total) by mouth once as needed for up to 1 dose (take 1 hour prior to vasectomy). 10/28/2024: Only for procedure in January   No facility-administered medications prior to visit.

## 2024-11-02 ENCOUNTER — Encounter: Payer: Self-pay | Admitting: Physician Assistant

## 2024-12-01 ENCOUNTER — Emergency Department
Admission: EM | Admit: 2024-12-01 | Discharge: 2024-12-02 | Disposition: A | Attending: Emergency Medicine | Admitting: Emergency Medicine

## 2024-12-01 ENCOUNTER — Other Ambulatory Visit: Payer: Self-pay

## 2024-12-01 ENCOUNTER — Ambulatory Visit (INDEPENDENT_AMBULATORY_CARE_PROVIDER_SITE_OTHER): Admitting: Urology

## 2024-12-01 VITALS — BP 171/91 | HR 121

## 2024-12-01 DIAGNOSIS — R55 Syncope and collapse: Secondary | ICD-10-CM | POA: Diagnosis present

## 2024-12-01 DIAGNOSIS — E119 Type 2 diabetes mellitus without complications: Secondary | ICD-10-CM | POA: Insufficient documentation

## 2024-12-01 DIAGNOSIS — N99820 Postprocedural hemorrhage and hematoma of a genitourinary system organ or structure following a genitourinary system procedure: Secondary | ICD-10-CM | POA: Diagnosis present

## 2024-12-01 DIAGNOSIS — I10 Essential (primary) hypertension: Secondary | ICD-10-CM | POA: Insufficient documentation

## 2024-12-01 DIAGNOSIS — Z9852 Vasectomy status: Secondary | ICD-10-CM

## 2024-12-01 DIAGNOSIS — Z302 Encounter for sterilization: Secondary | ICD-10-CM

## 2024-12-01 DIAGNOSIS — D649 Anemia, unspecified: Secondary | ICD-10-CM | POA: Diagnosis not present

## 2024-12-01 MED ORDER — LIDOCAINE HCL (PF) 1 % IJ SOLN
5.0000 mL | Freq: Once | INTRAMUSCULAR | Status: AC
  Administered 2024-12-01: 5 mL
  Filled 2024-12-01: qty 5

## 2024-12-01 NOTE — Progress Notes (Signed)
 VASECTOMY PROCEDURE NOTE:  The patient was taken to the minor procedure room and placed in the supine position. His genitals were prepped and draped in the usual sterile fashion. The right vas deferens was brought up to the skin of the right upper scrotum. The skin overlying it was anesthetized with 1% lidocaine  without epinephrine, anesthetic was also injected alongside the vas deferens in the direction of the inguinal canal. The no scalpel vasectomy instrument was used to make a small perforation in the scrotal skin. The vasectomy clamp was used to grasp the vas deferens. It was carefully dissected free from surrounding structures. A 1cm segment of the vas was removed, and the cut ends of the mucosa were cauterized. No significant bleeding was noted. The vas deferens was returned to the scrotum. The skin incision was closed with a simple interrupted stitch of 4-0 chromic.  Attention was then turned to the left side. The left vasectomy was performed in the same exact fashion. Sterile dressings were placed over each incision. The patient tolerated the procedure well.  IMPRESSION/DIAGNOSIS: The patient is a 39 year old gentleman who underwent a vasectomy today. Post-procedure instructions were reviewed. I stressed the importance of continuing to use birth control until he provides a semen specimen more than 2 months from now that demonstrates azoospermia.  We discussed return precautions including fever over 101, significant bleeding or hematoma, or uncontrolled pain. I also stressed the importance of avoiding strenuous activity for one week, no sexual activity or ejaculations for 5 days, intermittent icing over the next 48 hours, and scrotal support.   PLAN: The patient will be advised of his semen analysis results when available.  Redell Burnet, MD 12/01/2024

## 2024-12-01 NOTE — ED Triage Notes (Addendum)
 Pt arrives via EMS from home; vasectomy at 330pm today--bleeding large amount from wound since 530pm (thru clothes, toilet bowl full and now saturated two trauma pads--cont to bleed despite pressure held)

## 2024-12-01 NOTE — ED Triage Notes (Signed)
 BIB [facility] from home. Pt had vasectomy earlier today. States while at home went to urinate at 1730 and began to bleed from surgical site. Large gauze bandage in place on arrival with continuous seeping of blood. Pt alert and oriented following commands. Breathing unlabored speaking in full sentences with symmetric chest rise and fall.

## 2024-12-01 NOTE — Patient Instructions (Signed)

## 2024-12-01 NOTE — ED Provider Notes (Signed)
 "  [facility] Provider Note    Event Date/Time   First MD Initiated Contact with Patient 12/01/24 2133     (approximate)   History   Chief Complaint Post-op Problem   HPI  WILLIM TURNAGE is a 39 y.o. male with past medical history of hypertension and diabetes who presents to the ED complaining of postop problem.  Patient had a vasectomy with Dr. Francisca this morning, reports no issues with the procedure and had been doing well at home later in the day.  He then states that he went up to urinate and noticed significant bleeding from the right side of his scrotum.  He was unable to control bleeding with pressure and so came to the ED for evaluation.  He does not take any blood thinners.     Physical Exam   Triage Vital Signs: ED Triage Vitals  Encounter Vitals Group     BP 12/01/24 2125 (!) 156/90     Girls Systolic BP Percentile --      Girls Diastolic BP Percentile --      Boys Systolic BP Percentile --      Boys Diastolic BP Percentile --      Pulse Rate 12/01/24 2125 (!) 113     Resp 12/01/24 2125 18     Temp 12/01/24 2125 98.6 F (37 C)     Temp Source 12/01/24 2125 Oral     SpO2 12/01/24 2125 100 %     Weight 12/01/24 2124 (!) 340 lb (154.2 kg)     Height 12/01/24 2124 5' 10 (1.778 m)     Head Circumference --      Peak Flow --      Pain Score 12/01/24 2124 0     Pain Loc --      Pain Education --      Exclude from Growth Chart --     Most recent vital signs: Vitals:   12/01/24 2125  BP: (!) 156/90  Pulse: (!) 113  Resp: 18  Temp: 98.6 F (37 C)  SpO2: 100%    Constitutional: Alert and oriented. Eyes: Conjunctivae are normal. Head: Atraumatic. Nose: No congestion/rhinnorhea. Mouth/Throat: Mucous membranes are moist.  Cardiovascular: Normal rate, regular rhythm. Grossly normal heart sounds.  2+ radial pulses bilaterally. Respiratory: Normal respiratory effort.  No retractions. Lungs CTAB. Gastrointestinal: Soft and  nontender. No distention. Genitourinary: Vigorous bleeding noted from right sided surgical site, suture intact.  No scrotal hematoma noted. Musculoskeletal: No lower extremity tenderness nor edema.  Neurologic:  Normal speech and language. No gross focal neurologic deficits are appreciated.    ED Results / Procedures / Treatments   Labs (all labs ordered are listed, but only abnormal results are displayed) Labs Reviewed - No data to display   PROCEDURES:  Critical Care performed: No  Procedures   MEDICATIONS ORDERED IN ED: Medications  lidocaine  (PF) (XYLOCAINE ) 1 % injection 5 mL (5 mLs Other Given 12/01/24 2151)     IMPRESSION / MDM / ASSESSMENT AND PLAN / ED COURSE  I reviewed the triage vital signs and the nursing notes.                              39 y.o. male with no significant past medical history who presents to the ED complaining of bleeding from the right side of his scrotum following vasectomy earlier today.  Patient's presentation is most consistent with acute  presentation with potential threat to life or bodily function.  Differential diagnosis includes, but is not limited to, surgical site bleeding, wound dehiscence, hematoma.  Patient well-appearing and in no acute distress, vital signs are unremarkable.  On exam, he has vigorous bleeding noted from the right side of his scrotum, just lateral to the stitch, which still appears intact.  I was unable to control bleeding with Surgicel and pressure, patient then anesthetized with lidocaine  and an additional stitch was placed.  Patient had minimal improvement in bleeding initially and case was discussed with Dr. Donette of urology.  He recommends placing 2 additional sutures covering larger area of tissue.  I went to reassess the patient after his nurse had been holding pressure, bleeding now seems to be well-controlled.  I placed Surgicel over the site and new dressing was placed.  Patient was observed without  evidence of recurrent bleeding and is appropriate for discharge with urology follow-up.  He was counseled to return to the ED for new or worsening symptoms, patient agrees with plan.      FINAL CLINICAL IMPRESSION(S) / ED DIAGNOSES   Final diagnoses:  S/P vasectomy     Rx / DC Orders   ED Discharge Orders     None        Note:  This document was prepared using Dragon voice recognition software and may include unintentional dictation errors.   Willo Dunnings, MD 12/01/24 2330  "

## 2024-12-02 ENCOUNTER — Telehealth: Payer: Self-pay

## 2024-12-02 ENCOUNTER — Emergency Department
Admission: EM | Admit: 2024-12-02 | Discharge: 2024-12-02 | Disposition: A | Discharge status: Home / Self Care | Attending: Emergency Medicine | Admitting: Emergency Medicine

## 2024-12-02 ENCOUNTER — Emergency Department

## 2024-12-02 DIAGNOSIS — R55 Syncope and collapse: Secondary | ICD-10-CM | POA: Insufficient documentation

## 2024-12-02 DIAGNOSIS — D649 Anemia, unspecified: Secondary | ICD-10-CM | POA: Insufficient documentation

## 2024-12-02 LAB — CBC WITH DIFFERENTIAL/PLATELET
Abs Immature Granulocytes: 0.01 K/uL (ref 0.00–0.07)
Basophils Absolute: 0 K/uL (ref 0.0–0.1)
Basophils Relative: 1 %
Eosinophils Absolute: 0.1 K/uL (ref 0.0–0.5)
Eosinophils Relative: 2 %
HCT: 37.3 % — ABNORMAL LOW (ref 39.0–52.0)
Hemoglobin: 11.8 g/dL — ABNORMAL LOW (ref 13.0–17.0)
Immature Granulocytes: 0 %
Lymphocytes Relative: 37 %
Lymphs Abs: 2.3 K/uL (ref 0.7–4.0)
MCH: 27 pg (ref 26.0–34.0)
MCHC: 31.6 g/dL (ref 30.0–36.0)
MCV: 85.4 fL (ref 80.0–100.0)
Monocytes Absolute: 0.5 K/uL (ref 0.1–1.0)
Monocytes Relative: 7 %
Neutro Abs: 3.4 K/uL (ref 1.7–7.7)
Neutrophils Relative %: 53 %
Platelets: 228 K/uL (ref 150–400)
RBC: 4.37 MIL/uL (ref 4.22–5.81)
RDW: 13.2 % (ref 11.5–15.5)
WBC: 6.3 K/uL (ref 4.0–10.5)
nRBC: 0 % (ref 0.0–0.2)

## 2024-12-02 LAB — BASIC METABOLIC PANEL WITH GFR
Anion gap: 11 (ref 5–15)
BUN: 20 mg/dL (ref 6–20)
CO2: 25 mmol/L (ref 22–32)
Calcium: 9.5 mg/dL (ref 8.9–10.3)
Chloride: 100 mmol/L (ref 98–111)
Creatinine, Ser: 1.44 mg/dL — ABNORMAL HIGH (ref 0.61–1.24)
GFR, Estimated: >60 mL/min
Glucose, Bld: 175 mg/dL — ABNORMAL HIGH (ref 70–99)
Potassium: 4.1 mmol/L (ref 3.5–5.1)
Sodium: 136 mmol/L (ref 135–145)

## 2024-12-02 LAB — TROPONIN T, HIGH SENSITIVITY
Troponin T High Sensitivity: 30 ng/L — ABNORMAL HIGH (ref 0–19)
Troponin T High Sensitivity: 23 ng/L — ABNORMAL HIGH (ref 0–19)

## 2024-12-02 MED ORDER — SODIUM CHLORIDE 0.9 % IV BOLUS
1000.0000 mL | Freq: Once | INTRAVENOUS | Status: AC
  Administered 2024-12-02: 1000 mL via INTRAVENOUS

## 2024-12-02 NOTE — Telephone Encounter (Signed)
 PT LM on triage line stating we called him regarding his bleeding after his vas.  He states he has not had any further bleeding.   Pt was seen in ed for right sided scrotum bleeding s/p vas on 1/15 with BCD. Ed placed another stitch applied pressure and surgicel.   S/w CB she has not contacted pt. Requested CB to f/u with pt.

## 2024-12-02 NOTE — ED Triage Notes (Signed)
 Pt was seen here today for post op bleeding following a vasectomy. Went home and passed out. States he was sitting and eating, felt dizzy. Per family, he did fall and hit head. Pt denies pain.

## 2024-12-02 NOTE — Telephone Encounter (Signed)
 Followed up with patient inquiring current status after ED visit. Patient stated he is doing well at the moment. No additional bleeding after the additional stitched was placed. Patient states he is still wearing compression underwear. re-advised no strenuous activity for one week, no sexual activity for 5 days, intermittent icing over the next 24-48 hours. OTC pain medication for pain relief. Patient verbalized understanding.

## 2024-12-02 NOTE — ED Provider Notes (Signed)
 "  [facility] Provider Note    Event Date/Time   First MD Initiated Contact with Patient 12/02/24 0149     (approximate)   History   Loss of Consciousness   HPI  Randall Morrison is a 39 y.o. male who presents to the emergency department today after a syncopal episode.  The patient had been seen earlier in the emergency department because of bleeding from a vasectomy site.  The procedure had been done earlier in the day.  Per ED note he had an extra suture placed and his bleeding stopped.  The patient then went home.  He was sitting down when he started feeling dizzy.  He then passed out and woke up on the floor.  He then had 1 more episode of dizziness on the way to the emergency department however at the time my exam he states he feels normal.  No further bleeding. Denies any chest pain or palpitations.     Physical Exam   Triage Vital Signs: ED Triage Vitals  Encounter Vitals Group     BP 12/02/24 0126 (!) 147/90     Girls Systolic BP Percentile --      Girls Diastolic BP Percentile --      Boys Systolic BP Percentile --      Boys Diastolic BP Percentile --      Pulse Rate 12/02/24 0126 88     Resp 12/02/24 0126 18     Temp 12/02/24 0126 98.3 F (36.8 C)     Temp Source 12/02/24 0126 Oral     SpO2 --      Weight 12/02/24 0127 (!) 340 lb (154.2 kg)     Height 12/02/24 0127 5' 10 (1.778 m)     Head Circumference --      Peak Flow --      Pain Score 12/02/24 0127 0     Pain Loc --      Pain Education --      Exclude from Growth Chart --     Most recent vital signs: Vitals:   12/02/24 0126  BP: (!) 147/90  Pulse: 88  Resp: 18  Temp: 98.3 F (36.8 C)   General: Awake, alert. Proemted/ CV:  Good peripheral perfusion. Regular rate and rhythm. Resp:  Normal effort. Lungs clear. Abd:  No distention.   ED Results / Procedures / Treatments   Labs (all labs ordered are listed, but only abnormal results are displayed) Labs Reviewed   CBC WITH DIFFERENTIAL/PLATELET - Abnormal; Notable for the following components:      Result Value   Hemoglobin 11.8 (*)    HCT 37.3 (*)    All other components within normal limits  BASIC METABOLIC PANEL WITH GFR - Abnormal; Notable for the following components:   Glucose, Bld 175 (*)    Creatinine, Ser 1.44 (*)    All other components within normal limits  TROPONIN T, HIGH SENSITIVITY - Abnormal; Notable for the following components:   Troponin T High Sensitivity 30 (*)    All other components within normal limits  TROPONIN T, HIGH SENSITIVITY - Abnormal; Notable for the following components:   Troponin T High Sensitivity 23 (*)    All other components within normal limits     EKG  I, Guadalupe Eagles, attending physician, personally viewed and interpreted this EKG  EKG Time: 0206 Rate: 76 Rhythm: normal sinus rhythm Axis: normal Intervals: qtc 382 QRS: narrow, q waves v1, v3 ST changes: no  st elevation Impression: abnormal ekg  RADIOLOGY I independently interpreted and visualized the CT head. My interpretation: No [facility] Radiology interpretation:  IMPRESSION:  No acute intracranial abnormality noted.     PROCEDURES:  Critical Care performed: No    MEDICATIONS ORDERED IN ED: Medications - No data to display   IMPRESSION / MDM / ASSESSMENT AND PLAN / ED COURSE  I reviewed the triage vital signs and the nursing notes.                              Differential diagnosis includes, but is not limited to, anemia, dehydration, vasovagal episode, ACS, arrhythmia  Patient's presentation is most consistent with acute presentation with potential threat to life or bodily function.   Patient presented to the emergency department today after a syncopal episode.  Of note patient had been seen earlier in the ED secondary to bleeding from the vasectomy site.  Blood work here does show that the patient has dropped his hemoglobin level from the most recent blood work.  I do  think this could be a part of what caused the patient's syncope.  Fortunately however no further bleeding at this time.  Patient was given IV fluids.  He did feel better.  I did encourage patient to take it easy over the next couple days given decrease in his blood level.  I do think however this will slowly increase as the patient does not have any further bleeding.   FINAL CLINICAL IMPRESSION(S) / ED DIAGNOSES   Final diagnoses:  Syncope, unspecified syncope type  Anemia, unspecified type      Note:  This document was prepared using Dragon voice recognition software and may include unintentional dictation errors.    Floy Roberts, MD 12/02/24 [phone]  "

## 2024-12-12 ENCOUNTER — Ambulatory Visit: Admitting: Physician Assistant

## 2024-12-12 NOTE — Progress Notes (Unsigned)
 " Established patient visit  Patient: Randall Morrison   DOB: 28-Oct-1986   39 y.o. Male  MRN: [id number] Visit Date: 12/15/2024  Today's healthcare provider: Jolynn Spencer, PA-C   No chief complaint on file.  Subjective       Discussed the use of AI scribe software for clinical note transcription with the patient, who gave verbal consent to proceed.  History of Present Illness        10/28/2024    3:43 PM 09/20/2024    3:43 PM 08/28/2023    3:29 PM  Depression screen PHQ 2/9  Decreased Interest 0 0 0  Down, Depressed, Hopeless 0 0 0  PHQ - 2 Score 0 0 0  Altered sleeping 0    Tired, decreased energy 0    Change in appetite 0    Feeling bad or failure about yourself  0    Trouble concentrating 0    Moving slowly or fidgety/restless 0    Suicidal thoughts 0    PHQ-9 Score 0    Difficult doing work/chores Not difficult at all        10/28/2024    3:43 PM  GAD 7 : Generalized Anxiety Score  Nervous, Anxious, on Edge 0   Control/stop worrying 0   Worry too much - different things 0   Trouble relaxing 0   Restless 0   Easily annoyed or irritable 0   Afraid - awful might happen 0   Total GAD 7 Score 0  Anxiety Difficulty Not difficult at all     Data saved with a previous flowsheet row definition    Medications: Show/hide medication list[1]  Review of Systems  All other systems reviewed and are negative.  All negative Except see HPI   {Insert previous labs (optional):23779} {See past labs  Heme  Chem  Endocrine  Serology  Results Review (optional):1}   Objective    There were no vitals taken for this visit. {Insert last BP/Wt (optional):23777}{See vitals history (optional):1}   Physical Exam Vitals reviewed.  Constitutional:      General: He is not in acute distress.    Appearance: Normal appearance. He is not diaphoretic.  HENT:     Head: Normocephalic and atraumatic.  Eyes:     General: No scleral icterus.    Conjunctiva/sclera:  Conjunctivae normal.  Cardiovascular:     Rate and Rhythm: Normal rate and regular rhythm.     Pulses: Normal pulses.     Heart sounds: Normal heart sounds. No murmur heard. Pulmonary:     Effort: Pulmonary effort is normal. No respiratory distress.     Breath sounds: Normal breath sounds. No wheezing or rhonchi.  Musculoskeletal:     Cervical back: Neck supple.     Right lower leg: No edema.     Left lower leg: No edema.  Lymphadenopathy:     Cervical: No cervical adenopathy.  Skin:    General: Skin is warm and dry.     Findings: No rash.  Neurological:     Mental Status: He is alert and oriented to person, place, and time. Mental status is at baseline.  Psychiatric:        Mood and Affect: Mood normal.        Behavior: Behavior normal.     No results found for any visits on 12/15/24.      Assessment and Plan Assessment & Plan     No orders of the defined types were placed in this  encounter.   No follow-ups on file.   The patient was advised to call back or seek an in-person evaluation if the symptoms worsen or if the condition fails to improve as anticipated.  I discussed the assessment and treatment plan with the patient. The patient was provided an opportunity to ask questions and all were answered. The patient agreed with the plan and demonstrated an understanding of the instructions.  I, Anahla Bevis, PA-C have reviewed all documentation for this visit. The documentation on 12/15/2024  for the exam, diagnosis, procedures, and orders are all accurate and complete.  Jolynn Spencer, [facility], MMS [facility] [phone] (phone) [phone] (fax)  [facility]     [1] Outpatient Medications Prior to Visit  Medication Sig   amLODipine  (NORVASC ) 10 MG tablet Take 1 tablet (10 mg total) by mouth daily.   losartan -hydrochlorothiazide  (HYZAAR) 50-12.5 MG tablet Take 1 tablet by mouth daily.   metFORMIN  (GLUCOPHAGE ) 500 MG tablet Take 1  tablet (500 mg total) by mouth daily.   semaglutide-weight management (WEGOVY) 0.25 MG/0.5ML SOAJ SQ injection Inject 0.25 mg into the skin once a week.   No facility-administered medications prior to visit.  "

## 2024-12-15 ENCOUNTER — Ambulatory Visit: Admitting: Physician Assistant

## 2024-12-15 DIAGNOSIS — R7303 Prediabetes: Secondary | ICD-10-CM

## 2024-12-15 DIAGNOSIS — I10 Essential (primary) hypertension: Secondary | ICD-10-CM

## 2024-12-15 DIAGNOSIS — J302 Other seasonal allergic rhinitis: Secondary | ICD-10-CM

## 2024-12-15 DIAGNOSIS — R7989 Other specified abnormal findings of blood chemistry: Secondary | ICD-10-CM

## 2025-01-23 ENCOUNTER — Ambulatory Visit: Admitting: Physician Assistant

## 2025-03-01 ENCOUNTER — Other Ambulatory Visit
